# Patient Record
Sex: Female | Born: 1960 | Race: White | Hispanic: No | Marital: Single | Smoking: Current every day smoker
Health system: Southern US, Community
[De-identification: ages and names within clinical notes are randomized; demographics above are authoritative.]

## PROBLEM LIST (undated history)

## (undated) DIAGNOSIS — F329 Major depressive disorder, single episode, unspecified: Secondary | ICD-10-CM

## (undated) DIAGNOSIS — T7840XA Allergy, unspecified, initial encounter: Secondary | ICD-10-CM

## (undated) DIAGNOSIS — F419 Anxiety disorder, unspecified: Secondary | ICD-10-CM

## (undated) DIAGNOSIS — F32A Depression, unspecified: Secondary | ICD-10-CM

## (undated) DIAGNOSIS — I10 Essential (primary) hypertension: Secondary | ICD-10-CM

## (undated) HISTORY — DX: Essential (primary) hypertension: I10

## (undated) HISTORY — DX: Major depressive disorder, single episode, unspecified: F32.9

## (undated) HISTORY — DX: Allergy, unspecified, initial encounter: T78.40XA

## (undated) HISTORY — PX: OTHER SURGICAL HISTORY: SHX169

## (undated) HISTORY — DX: Anxiety disorder, unspecified: F41.9

## (undated) HISTORY — DX: Depression, unspecified: F32.A

## (undated) HISTORY — PX: APPENDECTOMY: SHX54

---
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
## 1999-11-12 ENCOUNTER — Emergency Department (HOSPITAL_COMMUNITY): Admission: EM | Admit: 1999-11-12 | Discharge: 1999-11-12 | Payer: Self-pay | Admitting: Emergency Medicine

## 1999-11-12 ENCOUNTER — Encounter: Payer: Self-pay | Admitting: Emergency Medicine

## 2000-08-07 ENCOUNTER — Emergency Department (HOSPITAL_COMMUNITY): Admission: EM | Admit: 2000-08-07 | Discharge: 2000-08-07 | Payer: Self-pay | Admitting: Emergency Medicine

## 2002-03-13 ENCOUNTER — Inpatient Hospital Stay (HOSPITAL_COMMUNITY): Admission: EM | Admit: 2002-03-13 | Discharge: 2002-03-17 | Payer: Self-pay | Admitting: *Deleted

## 2004-01-24 ENCOUNTER — Ambulatory Visit: Payer: Self-pay | Admitting: Family Medicine

## 2004-03-01 ENCOUNTER — Ambulatory Visit: Payer: Self-pay | Admitting: Family Medicine

## 2004-03-02 ENCOUNTER — Ambulatory Visit: Payer: Self-pay | Admitting: *Deleted

## 2004-03-15 ENCOUNTER — Ambulatory Visit: Payer: Self-pay | Admitting: Family Medicine

## 2004-04-13 ENCOUNTER — Emergency Department (HOSPITAL_COMMUNITY): Admission: EM | Admit: 2004-04-13 | Discharge: 2004-04-13 | Payer: Self-pay | Admitting: Emergency Medicine

## 2004-04-27 ENCOUNTER — Ambulatory Visit: Payer: Self-pay | Admitting: Family Medicine

## 2005-01-23 ENCOUNTER — Ambulatory Visit: Payer: Self-pay | Admitting: Family Medicine

## 2005-02-13 ENCOUNTER — Ambulatory Visit: Payer: Self-pay | Admitting: Family Medicine

## 2005-04-11 ENCOUNTER — Ambulatory Visit: Payer: Self-pay | Admitting: Family Medicine

## 2005-04-24 ENCOUNTER — Ambulatory Visit (HOSPITAL_COMMUNITY): Admission: RE | Admit: 2005-04-24 | Discharge: 2005-04-24 | Payer: Self-pay | Admitting: Family Medicine

## 2005-07-17 ENCOUNTER — Encounter (INDEPENDENT_AMBULATORY_CARE_PROVIDER_SITE_OTHER): Payer: Self-pay | Admitting: Family Medicine

## 2005-07-17 LAB — CONVERTED CEMR LAB

## 2005-07-25 ENCOUNTER — Encounter (INDEPENDENT_AMBULATORY_CARE_PROVIDER_SITE_OTHER): Payer: Self-pay | Admitting: Family Medicine

## 2005-07-25 ENCOUNTER — Ambulatory Visit: Payer: Self-pay | Admitting: Family Medicine

## 2005-08-02 ENCOUNTER — Ambulatory Visit: Payer: Self-pay | Admitting: Family Medicine

## 2005-08-09 ENCOUNTER — Ambulatory Visit: Payer: Self-pay | Admitting: Family Medicine

## 2005-12-18 ENCOUNTER — Ambulatory Visit: Payer: Self-pay | Admitting: Family Medicine

## 2006-01-29 ENCOUNTER — Ambulatory Visit: Payer: Self-pay | Admitting: Family Medicine

## 2006-08-07 ENCOUNTER — Ambulatory Visit: Payer: Self-pay | Admitting: Family Medicine

## 2006-10-02 ENCOUNTER — Ambulatory Visit: Payer: Self-pay | Admitting: Internal Medicine

## 2006-11-05 ENCOUNTER — Encounter (INDEPENDENT_AMBULATORY_CARE_PROVIDER_SITE_OTHER): Payer: Self-pay | Admitting: Family Medicine

## 2006-11-05 DIAGNOSIS — L708 Other acne: Secondary | ICD-10-CM | POA: Insufficient documentation

## 2006-11-05 DIAGNOSIS — I10 Essential (primary) hypertension: Secondary | ICD-10-CM | POA: Insufficient documentation

## 2006-11-05 DIAGNOSIS — E785 Hyperlipidemia, unspecified: Secondary | ICD-10-CM | POA: Insufficient documentation

## 2006-11-18 DIAGNOSIS — F319 Bipolar disorder, unspecified: Secondary | ICD-10-CM | POA: Insufficient documentation

## 2006-11-18 DIAGNOSIS — L280 Lichen simplex chronicus: Secondary | ICD-10-CM | POA: Insufficient documentation

## 2006-11-22 ENCOUNTER — Ambulatory Visit: Payer: Self-pay | Admitting: Family Medicine

## 2006-12-04 ENCOUNTER — Encounter (INDEPENDENT_AMBULATORY_CARE_PROVIDER_SITE_OTHER): Payer: Self-pay | Admitting: *Deleted

## 2007-04-28 ENCOUNTER — Ambulatory Visit: Payer: Self-pay | Admitting: Nurse Practitioner

## 2007-05-05 ENCOUNTER — Ambulatory Visit: Payer: Self-pay | Admitting: Nurse Practitioner

## 2007-05-05 DIAGNOSIS — R110 Nausea: Secondary | ICD-10-CM | POA: Insufficient documentation

## 2007-05-05 LAB — CONVERTED CEMR LAB
Beta hcg, urine, semiquantitative: NEGATIVE
Bilirubin Urine: NEGATIVE
Glucose, Urine, Semiquant: NEGATIVE
Ketones, urine, test strip: NEGATIVE
Nitrite: NEGATIVE
Protein, U semiquant: NEGATIVE
Specific Gravity, Urine: 1.015
Urobilinogen, UA: NEGATIVE
pH: 6

## 2007-05-06 ENCOUNTER — Telehealth (INDEPENDENT_AMBULATORY_CARE_PROVIDER_SITE_OTHER): Payer: Self-pay | Admitting: Nurse Practitioner

## 2007-09-12 ENCOUNTER — Telehealth (INDEPENDENT_AMBULATORY_CARE_PROVIDER_SITE_OTHER): Payer: Self-pay | Admitting: *Deleted

## 2007-09-26 ENCOUNTER — Encounter (INDEPENDENT_AMBULATORY_CARE_PROVIDER_SITE_OTHER): Payer: Self-pay | Admitting: *Deleted

## 2008-03-03 ENCOUNTER — Encounter (INDEPENDENT_AMBULATORY_CARE_PROVIDER_SITE_OTHER): Payer: Self-pay | Admitting: Family Medicine

## 2008-05-17 ENCOUNTER — Ambulatory Visit: Payer: Self-pay | Admitting: Nurse Practitioner

## 2008-05-17 DIAGNOSIS — F17200 Nicotine dependence, unspecified, uncomplicated: Secondary | ICD-10-CM | POA: Insufficient documentation

## 2008-05-17 LAB — CONVERTED CEMR LAB
ALT: 10 units/L (ref 0–35)
AST: 15 units/L (ref 0–37)
Albumin: 4.3 g/dL (ref 3.5–5.2)
Alkaline Phosphatase: 74 units/L (ref 39–117)
BUN: 11 mg/dL (ref 6–23)
Band Neutrophils: 0 % (ref 0–10)
Basophils Absolute: 0 10*3/uL (ref 0.0–0.1)
Basophils Relative: 1 % (ref 0–1)
CO2: 21 meq/L (ref 19–32)
Calcium: 8.9 mg/dL (ref 8.4–10.5)
Chloride: 104 meq/L (ref 96–112)
Cholesterol, target level: 200 mg/dL
Cholesterol: 185 mg/dL (ref 0–200)
Creatinine, Ser: 0.66 mg/dL (ref 0.40–1.20)
Eosinophils Absolute: 0.3 10*3/uL (ref 0.0–0.7)
Eosinophils Relative: 5 % (ref 0–5)
Glucose, Bld: 79 mg/dL (ref 70–99)
HCT: 40.9 % (ref 36.0–46.0)
HDL goal, serum: 40 mg/dL
HDL: 37 mg/dL — ABNORMAL LOW (ref >39)
Hemoglobin: 12.8 g/dL (ref 12.0–15.0)
LDL Cholesterol: 118 mg/dL — ABNORMAL HIGH (ref 0–99)
LDL Goal: 130 mg/dL
Lymphocytes Relative: 22 % (ref 12–46)
Lymphs Abs: 1.5 10*3/uL (ref 0.7–4.0)
MCHC: 31.3 g/dL (ref 30.0–36.0)
MCV: 88.7 fL (ref 78.0–100.0)
Monocytes Absolute: 0.7 10*3/uL (ref 0.1–1.0)
Monocytes Relative: 10 % (ref 3–12)
Neutro Abs: 4.3 10*3/uL (ref 1.7–7.7)
Neutrophils Relative %: 63 % (ref 43–77)
Platelets: 325 10*3/uL (ref 150–400)
Potassium: 4.3 meq/L (ref 3.5–5.3)
RBC: 4.61 M/uL (ref 3.87–5.11)
RDW: 14.6 % (ref 11.5–15.5)
Sodium: 139 meq/L (ref 135–145)
TSH: 1.18 microintl units/mL (ref 0.350–4.50)
Total Bilirubin: 0.3 mg/dL (ref 0.3–1.2)
Total CHOL/HDL Ratio: 5
Total Protein: 7.9 g/dL (ref 6.0–8.3)
Triglycerides: 150 mg/dL — ABNORMAL HIGH (ref <150)
VLDL: 30 mg/dL (ref 0–40)
WBC: 6.8 10*3/uL (ref 4.0–10.5)

## 2008-05-28 ENCOUNTER — Telehealth (INDEPENDENT_AMBULATORY_CARE_PROVIDER_SITE_OTHER): Payer: Self-pay | Admitting: *Deleted

## 2009-10-27 ENCOUNTER — Ambulatory Visit: Payer: Self-pay | Admitting: Nurse Practitioner

## 2009-10-27 LAB — CONVERTED CEMR LAB
ALT: 9 units/L (ref 0–35)
AST: 14 units/L (ref 0–37)
Albumin: 4.8 g/dL (ref 3.5–5.2)
Alkaline Phosphatase: 65 units/L (ref 39–117)
BUN: 11 mg/dL (ref 6–23)
Basophils Absolute: 0 10*3/uL (ref 0.0–0.1)
Basophils Relative: 0 % (ref 0–1)
Bilirubin Urine: NEGATIVE
CO2: 19 meq/L (ref 19–32)
Calcium: 9.4 mg/dL (ref 8.4–10.5)
Chlamydia, DNA Probe: NEGATIVE
Chloride: 103 meq/L (ref 96–112)
Cholesterol: 191 mg/dL (ref 0–200)
Creatinine, Ser: 0.72 mg/dL (ref 0.40–1.20)
Eosinophils Absolute: 0.2 10*3/uL (ref 0.0–0.7)
Eosinophils Relative: 2 % (ref 0–5)
GC Probe Amp, Genital: NEGATIVE
Glucose, Bld: 79 mg/dL (ref 70–99)
Glucose, Urine, Semiquant: NEGATIVE
HCT: 41.9 % (ref 36.0–46.0)
HDL: 40 mg/dL (ref >39)
Hemoglobin: 13.5 g/dL (ref 12.0–15.0)
KOH Prep: NEGATIVE
Ketones, urine, test strip: NEGATIVE
LDL Cholesterol: 126 mg/dL — ABNORMAL HIGH (ref 0–99)
Lymphocytes Relative: 16 % (ref 12–46)
Lymphs Abs: 1.7 10*3/uL (ref 0.7–4.0)
MCHC: 32.2 g/dL (ref 30.0–36.0)
MCV: 89.7 fL (ref 78.0–100.0)
Microalb, Ur: 1.52 mg/dL (ref 0.00–1.89)
Monocytes Absolute: 1.1 10*3/uL — ABNORMAL HIGH (ref 0.1–1.0)
Monocytes Relative: 11 % (ref 3–12)
Neutro Abs: 7.3 10*3/uL (ref 1.7–7.7)
Neutrophils Relative %: 70 % (ref 43–77)
Nitrite: NEGATIVE
Platelets: 316 10*3/uL (ref 150–400)
Potassium: 4 meq/L (ref 3.5–5.3)
Protein, U semiquant: NEGATIVE
RBC: 4.67 M/uL (ref 3.87–5.11)
RDW: 15 % (ref 11.5–15.5)
Sodium: 135 meq/L (ref 135–145)
Specific Gravity, Urine: >=1.03
TSH: 0.885 microintl units/mL (ref 0.350–4.500)
Total Bilirubin: 0.5 mg/dL (ref 0.3–1.2)
Total CHOL/HDL Ratio: 4.8
Total Protein: 8 g/dL (ref 6.0–8.3)
Triglycerides: 124 mg/dL (ref <150)
Urobilinogen, UA: 0.2
VLDL: 25 mg/dL (ref 0–40)
WBC: 10.5 10*3/uL (ref 4.0–10.5)
pH: 5.5

## 2009-11-01 ENCOUNTER — Ambulatory Visit: Payer: Self-pay | Admitting: Nurse Practitioner

## 2009-11-01 LAB — CONVERTED CEMR LAB: Pap Smear: NEGATIVE

## 2009-11-03 ENCOUNTER — Encounter (INDEPENDENT_AMBULATORY_CARE_PROVIDER_SITE_OTHER): Payer: Self-pay | Admitting: Nurse Practitioner

## 2010-01-16 ENCOUNTER — Emergency Department (HOSPITAL_COMMUNITY): Admission: EM | Admit: 2010-01-16 | Discharge: 2010-01-16 | Payer: Self-pay | Admitting: Emergency Medicine

## 2010-01-27 ENCOUNTER — Ambulatory Visit: Payer: Self-pay | Admitting: Internal Medicine

## 2010-01-27 DIAGNOSIS — S61409A Unspecified open wound of unspecified hand, initial encounter: Secondary | ICD-10-CM | POA: Insufficient documentation

## 2010-02-27 ENCOUNTER — Ambulatory Visit: Payer: Self-pay | Admitting: Nurse Practitioner

## 2010-04-09 ENCOUNTER — Encounter: Payer: Self-pay | Admitting: Internal Medicine

## 2010-04-20 NOTE — Assessment & Plan Note (Signed)
Summary: 1 MONTH FU FOR BP///KT  Nurse Visit   Vital Signs:  Patient profile:   50 year old female Pulse rate:   72 / minute Pulse rhythm:   regular Resp:     22 per minute BP sitting:   110 / 64  (right arm)  Vitals Entered By: Dutch Quint RN (February 27, 2010 2:22 PM)  Impression & Recommendations:  Problem # 1:  HYPERTENSION (ICD-401.9) BP much improved -- 110/64 Continue taking meds as ordered F/u in 3-4 months with provider.   Her updated medication list for this problem includes:    Lisinopril-hydrochlorothiazide 10-12.5 Mg Tabs (Lisinopril-hydrochlorothiazide) .Marland Kitchen... Take one tablet daily for blood pressure  Complete Medication List: 1)  Lisinopril-hydrochlorothiazide 10-12.5 Mg Tabs (Lisinopril-hydrochlorothiazide) .... Take one tablet daily for blood pressure 2)  Lexapro 10 Mg Tabs (Escitalopram oxalate) .... One once daily 3)  Zetia 10 Mg Tabs (Ezetimibe) .... One tablet by mouth daily for cholesterol 4)  Hydroxyzine Hcl 25 Mg Tabs (Hydroxyzine hcl) .... One  to two two times a day   Patient Instructions: 1)  Your blood pressure is much better -- 110/64 2)  Continue taking medications as ordered. 3)  Make appointment with your provider for a follow-up appointment in the next 3-4 months. 4)  Call if anything changes or if you have any questions.   Review of Systems CV:  Complains of lightheadness; States lightheadedness isn't new occurrence, postural changes.  Denies headache, cough, visual changes except some blurriness with close-up work and reading.Marland Kitchen   Physical Exam  General:  alert, well-developed, and well-hydrated.     CC:  BP recheck.  History of Present Illness: 01/27/10 BP 158/90.  Had sutures removed.  Had stopped taking HCTZ due to urinary frequency.  Started on lisinopril-HCTZ.  Had started taking only half a dose for the first week, afraid of getting nauseated.  States has been taking full dose about three weeks.  Here for BP recheck.  CC:  BP recheck Is Patient Diabetic? No Pain Assessment Patient in pain? yes     Location: left hand Intensity: 8 Type: aching, throbbing Onset of pain  laceration occurred 01/16/10.  Dependent position increases pain and edema, hurts to bend hand.  Does patient need assistance? Functional Status Self care Ambulation Normal   Allergies: 1)  ! Penicillin 2)  ! Erythromycin  Orders Added: 1)  Est. Patient Level I [[address]]

## 2010-04-20 NOTE — Progress Notes (Signed)
Summary: Office Visit/DEPRESSION SCREENING  Office Visit/DEPRESSION SCREENING   Imported By: Arta Bruce 10/31/2009 15:05:41  _____________________________________________________________________  External Attachment:    Type:   Image     Comment:   External Document

## 2010-04-20 NOTE — Assessment & Plan Note (Signed)
Summary: STITCHES REMOVED///KT  Nurse Visit   Vital Signs:  Patient profile:   50 year old female Temp:     97.0 degrees F oral Pulse rate:   76 / minute Pulse rhythm:   regular Resp:     20 per minute BP sitting:   158 / 90  (right arm)  Impression & Recommendations:  Problem # 1:  LACERATION, HAND, LEFT (ICD-882.0) Laceration well-healed, eight sutures removed.  Orders: Suture Removal by Non-Operative MD (E4540)  Problem # 2:  HYPERTENSION (ICD-401.9) BP still elevated c/o urinating too much  To stop HCTZ 25 mg. and start lisinopril-HCTZ Return in one month for BP recheck with triage nurse   Her updated medication list for this problem includes:    Lisinopril-hydrochlorothiazide 10-12.5 Mg Tabs (Lisinopril-hydrochlorothiazide) .Marland Kitchen... Take one tablet daily for blood pressure  Orders: Est. Patient Level II (98119)  Complete Medication List: 1)  Lisinopril-hydrochlorothiazide 10-12.5 Mg Tabs (Lisinopril-hydrochlorothiazide) .... Take one tablet daily for blood pressure 2)  Lexapro 10 Mg Tabs (Escitalopram oxalate) .... One once daily 3)  Zetia 10 Mg Tabs (Ezetimibe) .... One tablet by mouth daily for cholesterol 4)  Hydroxyzine Hcl 25 Mg Tabs (Hydroxyzine hcl) .... One  to two two times a day  Other Orders: Flu Vaccine 16yrs + (14782) Admin 1st Vaccine (95621)   CC:  suture removal left hand.  History of Present Illness: Took half of her medication this morning; states it makes her urinate all the time.   Review of Systems Derm:  s/p laceration to left hand, received 8 sutures.  Some residual swelling near fifth digit.Marland Kitchen   Physical Exam  Skin:  well-healing laceration to left lateral hand; edges well-approximated, no redness, slight edema to base of fifth digit.  No drainage or odor.   Patient Instructions: 1)  Reviewed with Jesse Fall 2)  Your sutures have been removed -- clean gently with soap and water.  You may apply antibiotic ointment lightly, then  cover with a bandage to keep the area clean. 3)  Your blood pressure is elevated -- 158/90. 4)  Stop taking the hydrochlorothiazide.   5)  Start the lisinopril-hydrochlorothiazide 10-12.5 mg, one tablet by mouth daily. 6)  You have received your flu vaccine. 7)  Return in one month to have your blood pressure checked by the triage nurse -- make sure you take all of your medications before you come. 8)  Call if anything changes or if you have any questions.  CC: suture removal left hand Is Patient Diabetic? No Pain Assessment Patient in pain? yes     Location: left hand Intensity: 6 Type: sharp/aching Onset of pain  left hand laceration  Does patient need assistance? Functional Status Self care Ambulation Normal   Allergies: 1)  ! Penicillin 2)  ! Erythromycin  Immunizations Administered:  Influenza Vaccine # 1:    Vaccine Type: Fluvax 3+    Site: right deltoid    Mfr: GlaxoSmithKline    Dose: 0.5 ml    Route: IM    Given by: Dutch Quint RN    Exp. Date: 09/16/2010    Lot #: [id number]    VIS given: 10/11/09 version given January 27, 2010.  Flu Vaccine Consent Questions:    Do you have a history of severe allergic reactions to this vaccine? no    Any prior history of allergic reactions to egg and/or gelatin? no    Do you have a sensitivity to the preservative Thimersol? no    Do  you have a past history of Guillan-Barre Syndrome? no    Do you currently have an acute febrile illness? no    Have you ever had a severe reaction to latex? no    Vaccine information given and explained to patient? yes    Are you currently pregnant? no  Orders Added: 1)  Flu Vaccine 81yrs + [90658] 2)  Admin 1st Vaccine [90471] 3)  Est. Patient Level II [19147] 4)  Suture Removal by Non-Operative MD [S0630] Prescriptions: LISINOPRIL-HYDROCHLOROTHIAZIDE 10-12.5 MG TABS (LISINOPRIL-HYDROCHLOROTHIAZIDE) Take one tablet daily for blood pressure  #30 x 3   Entered by:   Dutch Quint RN    Authorized by:   Lehman Prom FNP   Signed by:   Dutch Quint RN on 01/27/2010   Method used:   Print then Give to Patient   RxID:   [id number]

## 2010-04-20 NOTE — Assessment & Plan Note (Signed)
Summary: Complete Physical Exam   Vital Signs:  Patient profile:   50 year old female LMP:     09/2009 Height:      61.50 inches Weight:      94.2 pounds BMI:     17.57 Temp:     97.9 degrees F oral Pulse rate:   87 / minute Pulse rhythm:   regular Resp:     20 per minute BP sitting:   150 / 101  (left arm) Cuff size:   regular  Vitals Entered ByLevon Hedger (October 27, 2009 2:56 PM)  Serial Vital Signs/Assessments:  Time      Position  BP       Pulse  Resp  Temp     By                     152/100                        Levon Hedger  CC: CPP...needs forms filled out, Hypertension Management, Lipid Management Is Patient Diabetic? No Pain Assessment Patient in pain? no       Does patient need assistance? Functional Status Self care Ambulation Normal LMP (date): 09/2009     Enter LMP: 09/2009 Last PAP Result Done   CC:  CPP...needs forms filled out, Hypertension Management, and Lipid Management.  History of Present Illness:  Pt into the office for complete physical exam She also has a form that she needs to complete for DMV  PAP - last done in this office in 2007. irregular menstrual cycles  Mammogram - last done with last CPE in 2007 no history of breast cancer no self breast exams at home  optho  - no recent eye exams but does wear reading glasses  Dental - no recent dental exam  tdap - over 10 years since last booster  Hypertension History:      She denies headache, chest pain, and palpitations.  pt has not taken any BP meds in over 1 year.        Positive major cardiovascular risk factors include hyperlipidemia, hypertension, and current tobacco user.  Negative major cardiovascular risk factors include female age less than 90 years old, no history of diabetes, and negative family history for ischemic heart disease.        Further assessment for target organ damage reveals no history of ASHD, cardiac end-organ damage (CHF/LVH), stroke/TIA,  peripheral vascular disease, renal insufficiency, or hypertensive retinopathy.    Lipid Management History:      Positive NCEP/ATP III risk factors include HDL cholesterol less than 40, current tobacco user, and hypertension.  Negative NCEP/ATP III risk factors include female age less than 77 years old, non-diabetic, no family history for ischemic heart disease, no ASHD (atherosclerotic heart disease), no prior stroke/TIA, no peripheral vascular disease, and no history of aortic aneurysm.        Comments include: pt has not taken medications in over 1 year.      Habits & Providers  Alcohol-Tobacco-Diet     Alcohol type: quit in 2006     Tobacco Status: current     Tobacco Counseling: to quit use of tobacco products  Exercise-Depression-Behavior     Drug Use: no     Seat Belt Use: 100     Sun Exposure: occasionally  Comments: PHQ-9 score = 8  Medications Prior to Update: 1)  Hydrochlorothiazide 25 Mg Tabs (Hydrochlorothiazide) .... Take One (  1) By Mouth Daily For Blood Pressure 2)  Lexapro 10 Mg  Tabs (Escitalopram Oxalate) .... One Once Daily 3)  Naprosyn 500 Mg  Tabs (Naproxen) .... One By Mouth Every 12 Hours As Needed For Groin Pain 4)  Zetia 10 Mg  Tabs (Ezetimibe) .... One Tablet By Mouth Daily For Cholesterol 5)  Hydroxyzine Hcl 25 Mg  Tabs (Hydroxyzine Hcl) .... One  To Two Two Times A Day 6)  Micardis 80 Mg  Tabs (Telmisartan) .Marland Kitchen.. 1 Tablet By Mouth Daily For Blood Pressure 7)  Clobetasol Propionate 0.05 % Crea (Clobetasol Propionate) .... Apply Topically To Affected Area Twice Daily  Allergies (verified): 1)  ! Penicillin 2)  ! Erythromycin  Review of Systems General:  Denies fever. Eyes:  Denies blurring. ENT:  Denies earache. CV:  Denies chest pain or discomfort. Resp:  Denies cough. GI:  Denies abdominal pain, nausea, and vomiting. GU:  Denies discharge. MS:  Denies joint pain. Derm:  Complains of itching and lesion(s). Neuro:  Denies headaches. Psych:   Complains of anxiety; denies depression.  Physical Exam  General:  alert.  thin Head:  normocephalic.   Eyes:  pupils equal, pupils round, and pupils reactive to light.   Ears:  bil ears with moderate cerumen Nose:  no nasal discharge.   Mouth:  pharynx pink and moist and poor dentition.   Neck:  supple.   Chest Wall:  no mass.   Breasts:  no masses.   Lungs:  normal breath sounds.   Heart:  normal rate and regular rhythm.   Abdomen:  normal bowel sounds.   Msk:  normal ROM.   Extremities:  no edema Neurologic:  alert & oriented X3.   Skin:  multiple healed abrasions  Psych:  Oriented X3.    Pelvic Exam  Vulva:      normal appearance.   Urethra and Bladder:      Urethra--normal.   Vagina:      physiologic discharge.   Cervix:      normal.   Uterus:      smooth.   Adnexa:      normal.   Rectum:      normal, heme negative stool.      Impression & Recommendations:  Problem # 1:  ROUTINE GYNECOLOGICAL EXAMINATION (ICD-V72.31) pap done mammogram scheduled - self breast exam placcard given tdap given rec optho and dental exam labs done PHQ-9 score = 3 Orders: KOH/ WET Mount [phone]) Pap Smear, Thin Prep ( Collection of) (U0454) Hemoccult Guaiac-1 spec.(in office) (82270) T- GC Chlamydia (09811)  Problem # 2:  HYPERTENSION (ICD-401.9) advised pt to restart meds DASH diet The following medications were removed from the medication list:    Micardis 80 Mg Tabs (Telmisartan) .Marland Kitchen... 1 tablet by mouth daily for blood pressure Her updated medication list for this problem includes:    Hydrochlorothiazide 25 Mg Tabs (Hydrochlorothiazide) .Marland Kitchen... Take one (1) by mouth daily for blood pressure  Orders: T-TSH ([id number]) T-Urine Microalbumin w/creat. ratio [phone]) UA Dipstick w/o Micro (manual) (52841)  Problem # 3:  TOBACCO ABUSE (ICD-305.1) advised cessation  Problem # 4:  NEURODERMATITIS (ICD-698.3)  Complete Medication List: 1)  Hydrochlorothiazide  25 Mg Tabs (Hydrochlorothiazide) .... Take one (1) by mouth daily for blood pressure 2)  Lexapro 10 Mg Tabs (Escitalopram oxalate) .... One once daily 3)  Zetia 10 Mg Tabs (Ezetimibe) .... One tablet by mouth daily for cholesterol 4)  Hydroxyzine Hcl 25 Mg Tabs (Hydroxyzine hcl) .... One  to  two two times a day  Other Orders: T-Lipid Profile [phone]) T-Comprehensive Metabolic Panel [phone]) T-CBC w/Diff ([id number]) Rapid HIV  (92370) Tdap => 76yrs IM (53664) Admin 1st Vaccine (40347) Admin 1st Vaccine [facility]) [phone])  Hypertension Assessment/Plan:      The patient's hypertensive risk group is category B: At least one risk factor (excluding diabetes) with no target organ damage.  Her calculated 10 year risk of coronary heart disease is 13 %.  Today's blood pressure is 150/101.  Her blood pressure goal is < 140/90.  Lipid Assessment/Plan:      Based on NCEP/ATP III, the patient's risk factor category is "2 or more risk factors and a calculated 10 year CAD risk of < 20%".  The patient's lipid goals are as follows: Total cholesterol goal is 200; LDL cholesterol goal is 130; HDL cholesterol goal is 40; Triglyceride goal is 150.    Patient Instructions: 1)  Follow up in this office on Tuesday - August 16th  2)  Schedule in the office with Aggie Cosier - Triage schedule 3)  Provider will need to complete forms 4)  You have receved a tetanus vaccine.  It will not be due again for 10 years Prescriptions: HYDROCHLOROTHIAZIDE 25 MG TABS (HYDROCHLOROTHIAZIDE) Take one (1) by mouth daily for blood pressure  #90 x 1   Entered and Authorized by:   Lehman Prom FNP   Signed by:   Lehman Prom FNP on 10/27/2009   Method used:   Print then Give to Patient   RxID:   [id number]   Laboratory Results   Urine Tests  Date/Time Received: October 27, 2009 3:22 PM   Routine Urinalysis   Color: lt. yellow Appearance: Clear Glucose: negative   (Normal Range: Negative) Bilirubin:  negative   (Normal Range: Negative) Ketone: negative   (Normal Range: Negative) Spec. Gravity: >=1.030   (Normal Range: 1.003-1.035) Blood: trace-intact   (Normal Range: Negative) pH: 5.5   (Normal Range: 5.0-8.0) Protein: negative   (Normal Range: Negative) Urobilinogen: 0.2   (Normal Range: 0-1) Nitrite: negative   (Normal Range: Negative) Leukocyte Esterace: small   (Normal Range: Negative)    Date/Time Received: October 28, 2009 1:39 PM   Wet Mount/KOH Source: vaginal WBC/hpf: 1-5 Bacteria/hpf: rare Clue cells/hpf: none Yeast/hpf: none Trichomonas/hpf: none  Stool - Occult Blood Hemmoccult #1: negative Date: 10/28/2009   Laboratory Results   Urine Tests    Routine Urinalysis   Color: lt. yellow Appearance: Clear Glucose: negative   (Normal Range: Negative) Bilirubin: negative   (Normal Range: Negative) Ketone: negative   (Normal Range: Negative) Spec. Gravity: >=1.030   (Normal Range: 1.003-1.035) Blood: trace-intact   (Normal Range: Negative) pH: 5.5   (Normal Range: 5.0-8.0) Protein: negative   (Normal Range: Negative) Urobilinogen: 0.2   (Normal Range: 0-1) Nitrite: negative   (Normal Range: Negative) Leukocyte Esterace: small   (Normal Range: Negative)      Wet Mount Wet Mount KOH: Negative     Tetanus/Td Vaccine    Vaccine Type: Tdap    Site: left deltoid    Mfr: Sanofi Pasteur    Dose: 0.5 ml    Route: IM    Given by: Levon Hedger    Exp. Date: 03/01/2012    Lot #: [id number]    VIS given: 02/04/07 version given October 27, 2009.    ndc  60109-323-55   Appended Document: Complete Physical Exam     Allergies: 1)  ! Penicillin 2)  ! Erythromycin   Complete Medication List:  1)  Hydrochlorothiazide 25 Mg Tabs (Hydrochlorothiazide) .... Take one (1) by mouth daily for blood pressure 2)  Lexapro 10 Mg Tabs (Escitalopram oxalate) .... One once daily 3)  Zetia 10 Mg Tabs (Ezetimibe) .... One tablet by mouth daily for cholesterol 4)   Hydroxyzine Hcl 25 Mg Tabs (Hydroxyzine hcl) .... One  to two two times a day   Laboratory Results  Date/Time Received: October 28, 2009 3:49 PM   Other Tests  Rapid HIV: negative

## 2010-04-20 NOTE — Letter (Signed)
Summary: Lipid Letter  [facility]  [address] [place], [place] [address]   Phone: [phone]  Fax: [phone]    11/01/2009  Katie Montgomery [address] [place], [place]  [address]  Dear Katie Montgomery:  We have carefully reviewed your last lipid profile from 10/27/2009 and the results are noted below with a summary of recommendations for lipid management.    Cholesterol:       191     Goal: less than 200   HDL "good" Cholesterol:   40     Goal: greater than 40   LDL "bad" Cholesterol:   126     Goal: less than 130   Triglycerides:       124     Goal: less than 150    Labs done during recent office visit are normal.  Pap Smear results are normal.     Current Medications: 1)    Hydrochlorothiazide 25 Mg Tabs (Hydrochlorothiazide) .... Take one (1) by mouth daily for blood pressure 2)    Lexapro 10 Mg  Tabs (Escitalopram oxalate) .... One once daily 3)    Hydroxyzine Hcl 25 Mg  Tabs (Hydroxyzine hcl) .... One  to two two times a day  If you have any questions, please call. We appreciate being able to work with you.   Sincerely,    [facility] Beverley Fiedler MD

## 2010-04-20 NOTE — Assessment & Plan Note (Signed)
Summary: BP recheck visit  Nurse Visit   Vital Signs:  Patient profile:   50 year old female Weight:      92.6 pounds Pulse rate:   76 / minute Pulse rhythm:   regular Resp:     20 per minute  Vitals Entered By: Dutch Quint RN (November 01, 2009 10:30 AM)  Patient Instructions: 1)  Blood pressure is good -- goal is to keep it less than 140/90. 2)  Continue taking hydrochlorothiazide. 3)  Follow-up appt. in six months.  Call if anything changes before then.  CC: BP recheck Is Patient Diabetic? No Pain Assessment Patient in pain? no       Does patient need assistance? Functional Status Self care Ambulation Normal   CC:  BP recheck.  History of Present Illness: In office for f/u BP check.  Last visit was 150/101 -- taking HCTZ regularly, took meds this AM, has not smoked for over an hour before visit.   Review of Systems       States has headaches on and off.  Having some lateral left sided CP this am, thinks "it's just a little gas."  No diaphoresis or SOB, pulse regular.     Physical Exam  General:  alert, well-hydrated, and underweight appearing.   Head:  normocephalic.      Allergies: 1)  ! Penicillin 2)  ! Erythromycin  Orders Added: 1)  Est. Patient Level I [30865]

## 2010-04-20 NOTE — Letter (Signed)
Summary: PATIENT'S MEDICAL HISTORY  PATIENT'S MEDICAL HISTORY   Imported By: Arta Bruce 11/03/2009 11:33:25  _____________________________________________________________________  External Attachment:    Type:   Image     Comment:   External Document

## 2010-08-04 NOTE — Discharge Summary (Signed)
NAME:  Katie Montgomery, Katie Montgomery                        ACCOUNT NO.:  [id number]   MEDICAL RECORD NO.:  [id number]                   PATIENT TYPE:  IPS   LOCATION:  0504                                 FACILITY:  [facility]   PHYSICIAN:  Jeanice Lim, M.D.              DATE OF BIRTH:  Sep 15, 1960   DATE OF ADMISSION:  03/13/2002  DATE OF DISCHARGE:  03/17/2002                                 DISCHARGE SUMMARY   IDENTIFYING DATA:  This is a 51 year old separated white female voluntarily  admitted.  She presented with a history of self-inflicted injury.  She used  a Physicist, medical to cut both wrists at home.  She was drinking at the time.  She reported not having a history of drinking and felt intoxicated.  Intention was to die.  She left no note.  She continued to feel depressed.  The patient had a hospitalization in the past at [facility] for  cutting her wrists and [facility] for outpatient  followup.   MEDICATIONS:  1. Zoloft 50 mg t.i.d.  2. Doxycycline.   DRUG ALLERGIES:  PENICILLIN, ERYTHROMYCIN.   PHYSICAL EXAMINATION:  GENERAL: Essentially within normal limits.  The  patient did have dressings bilateral on both wrists.  NEUROLOGIC: Nonfocal.   LABORATORY DATA:  Routine admission labs: Urinalysis was negative.  CMET was  within normal limits.  CBC was within normal limits.  Urine drug screen was  negative.  Alcohol level 183.  Salicylate level 24.   MENTAL STATUS EXAM:  The patient was in bed, primarily cooperative.  Speech  was soft spoken.  Mood was sad.  Affect: Flat.  Thought process: Goal  directed.  Thought content: Negative for dangerous ideation or psychotic  symptoms.  Cognitive: Intact.  Judgment and insight: Fair.   ADMISSION DIAGNOSES:   AXIS I:  Major depressive disorder with suicidal ideation.   AXIS II:  Deferred.   AXIS III:  Lacerated wrists.   AXIS IV:  Moderate problems with primary support group and other  psychosocial stressors.   AXIS V:  [id number]   HOSPITAL COURSE:  The patient was admitted, ordered routine p.r.n.  medications, underwent further monitoring, and reported motivation to seek  substance abuse treatment.  The patient reported then planning to stay with  parents.  She had felt oversedated initially, got medication late, and then  later felt less sedated.  She had no mood swings and reported motivation to  remain sober and follow up with aftercare recommendations.   CONDITION ON DISCHARGE:  Her condition on discharge was improved.  Mood was  euthymic.  Affect: Brighter.  Thought processes: Goal directed.  Thought  content: Negative for dangerous ideation or psychotic symptoms.  The patient  reported no urge to cut on self or hurt self or others.  The patient felt  she had learned healthier coping skills.   DISCHARGE MEDICATIONS:  1. Lamictal  25 mg one q.a.m. and two q.h.s.  2. Zoloft 100 mg q.a.m.  3. Trazodone 100 mg q.h.s.  4. Trileptal 150 mg q.a.m. and 150 mg q.h.s.  5. Vistaril 50 mg q.6.h. p.r.n. anxiety.   FOLLOW UP:  The patient was to follow up at [facility] on Tuesday, January 6, with [place].   DISCHARGE DIAGNOSES:   AXIS I:  Major depressive disorder with suicidal ideation.   AXIS II:  Deferred.   AXIS III:  Lacerated wrists.   AXIS IV:  Moderate problems with primary support group and other  psychosocial stressors.   AXIS V:  Global assessment of functioning on discharge was 55.                                                Jeanice Lim, M.D.    JEM/MEDQ  D:  03/26/2002  T:  03/26/2002  Job:  [id number]

## 2011-04-24 ENCOUNTER — Other Ambulatory Visit (HOSPITAL_COMMUNITY): Payer: Self-pay | Admitting: Family Medicine

## 2011-04-24 ENCOUNTER — Other Ambulatory Visit: Payer: Self-pay | Admitting: Family Medicine

## 2011-04-24 DIAGNOSIS — Z1231 Encounter for screening mammogram for malignant neoplasm of breast: Secondary | ICD-10-CM

## 2011-04-24 DIAGNOSIS — N938 Other specified abnormal uterine and vaginal bleeding: Secondary | ICD-10-CM

## 2011-05-02 ENCOUNTER — Other Ambulatory Visit (HOSPITAL_COMMUNITY): Payer: Self-pay

## 2011-05-02 ENCOUNTER — Ambulatory Visit (HOSPITAL_COMMUNITY)
Admission: RE | Admit: 2011-05-02 | Discharge: 2011-05-02 | Disposition: A | Discharge status: Home / Self Care | Payer: Self-pay | Source: Ambulatory Visit | Attending: Family Medicine | Admitting: Family Medicine

## 2011-05-02 DIAGNOSIS — N938 Other specified abnormal uterine and vaginal bleeding: Secondary | ICD-10-CM | POA: Insufficient documentation

## 2011-05-02 DIAGNOSIS — N854 Malposition of uterus: Secondary | ICD-10-CM | POA: Insufficient documentation

## 2011-05-02 DIAGNOSIS — N949 Unspecified condition associated with female genital organs and menstrual cycle: Secondary | ICD-10-CM | POA: Insufficient documentation

## 2011-05-02 DIAGNOSIS — N72 Inflammatory disease of cervix uteri: Secondary | ICD-10-CM | POA: Insufficient documentation

## 2011-05-02 DIAGNOSIS — N925 Other specified irregular menstruation: Secondary | ICD-10-CM | POA: Insufficient documentation

## 2011-05-02 DIAGNOSIS — N83209 Unspecified ovarian cyst, unspecified side: Secondary | ICD-10-CM | POA: Insufficient documentation

## 2011-05-02 DIAGNOSIS — D259 Leiomyoma of uterus, unspecified: Secondary | ICD-10-CM | POA: Insufficient documentation

## 2011-05-23 ENCOUNTER — Ambulatory Visit (HOSPITAL_COMMUNITY): Payer: Self-pay

## 2011-06-11 ENCOUNTER — Encounter: Payer: Self-pay | Admitting: Family

## 2011-06-11 ENCOUNTER — Ambulatory Visit (INDEPENDENT_AMBULATORY_CARE_PROVIDER_SITE_OTHER): Payer: Self-pay | Admitting: Family

## 2011-06-11 ENCOUNTER — Other Ambulatory Visit (HOSPITAL_COMMUNITY)
Admission: RE | Admit: 2011-06-11 | Discharge: 2011-06-11 | Disposition: A | Discharge status: Home / Self Care | Payer: Self-pay | Source: Ambulatory Visit | Attending: Family | Admitting: Family

## 2011-06-11 VITALS — BP 126/88 | HR 75 | Temp 96.8°F | Ht 63.0 in | Wt 100.5 lb

## 2011-06-11 DIAGNOSIS — N83201 Unspecified ovarian cyst, right side: Secondary | ICD-10-CM

## 2011-06-11 DIAGNOSIS — N83209 Unspecified ovarian cyst, unspecified side: Secondary | ICD-10-CM

## 2011-06-11 DIAGNOSIS — N939 Abnormal uterine and vaginal bleeding, unspecified: Secondary | ICD-10-CM

## 2011-06-11 DIAGNOSIS — N938 Other specified abnormal uterine and vaginal bleeding: Secondary | ICD-10-CM | POA: Insufficient documentation

## 2011-06-11 DIAGNOSIS — Z01812 Encounter for preprocedural laboratory examination: Secondary | ICD-10-CM

## 2011-06-11 DIAGNOSIS — N925 Other specified irregular menstruation: Secondary | ICD-10-CM | POA: Insufficient documentation

## 2011-06-11 DIAGNOSIS — N949 Unspecified condition associated with female genital organs and menstrual cycle: Secondary | ICD-10-CM | POA: Insufficient documentation

## 2011-06-11 DIAGNOSIS — N926 Irregular menstruation, unspecified: Secondary | ICD-10-CM

## 2011-06-11 LAB — POCT PREGNANCY, URINE: Preg Test, Ur: NEGATIVE

## 2011-06-11 MED ORDER — METRONIDAZOLE 500 MG PO TABS: 500.0000 mg | ORAL_TABLET | Freq: Two times a day (BID) | ORAL | Status: AC

## 2011-06-11 NOTE — Progress Notes (Deleted)
  Subjective:    Patient ID: Katie Montgomery, female    DOB: 10-13-1960, 51 y.o.   MRN: [id number]  HPI    Review of Systems     Objective:   Physical Exam        Assessment & Plan:

## 2011-06-11 NOTE — Progress Notes (Signed)
  Subjective:     Katie Montgomery is an 51 y.o. woman who presents for irregular menses. She had been bleeding irregularly. She is now bleeding every 21-25 days and menses are lasting 7 days. She changes her pad or tampon every 2 hours. Clots are n/a cm in size. Dysmenorrhea:moderate, occurring first 1-2 days of flow. Cyclic symptoms include: anxiety, bloating and weight gain. Current contraception: condoms. History of infertility: no. History of abnormal Pap smear: normal 04/24/11.  Pt also report abnormal vaginal smell x 3 weeks, "fishy".    Last ultrasound result (04/24/11): 1. Endometrium upper normal in thickness at 8 mm without evidence  of endometrial fluid or mass.  2. Approximate 8 cm posterior fundal uterine fibroid. Retroverted  uterus.  3. Complex, septated cyst with a debris level involving the right  ovary approximating 4 cm. No free pelvic fluid to confirm cyst  rupture.  4. Nabothian cysts on the cervix.  Menstrual History: OB History    Grav Para Term Preterm Abortions TAB SAB Ect Mult Living   5 2 2  3 3    2        Patient's last menstrual period was 06/08/2011.    The following portions of the patient's history were reviewed and updated as appropriate: allergies, current medications, past family history, past medical history, past social history, past surgical history and problem list.  Review of Systems Pertinent items are noted in HPI.    Objective:    BP 126/88  Pulse 75  Temp(Src) 96.8 F (36 C) (Oral)  Ht 5\' 3"  (1.6 m)  Wt 100 lb 8 oz (45.587 kg)  BMI 17.80 kg/m2  LMP 06/08/2011  General:   alert, cooperative and appears stated age  Skin:    rash noted on extremities and abdomen  Neck:  supple, symmetrical, trachea midline and thyroid not enlarged, symmetric, no tenderness/mass/nodules  Abdomen:  abnormal findings:  tender on left side with palpation  Pelvic:   cervix normal in appearance, external genitalia normal, no cervical motion tenderness,  rectovaginal septum normal, vagina normal without discharge and uterus enlarged and tender with palpation.     Patient given informed consent, signed copy in the chart, time out was performed. Appropriate time out taken. . The patient was placed in the lithotomy position and the cervix brought into view with sterile speculum.  Portio of cervix cleansed x 2 with betadine swabs.  The uterus was sounded for depth of 8cm. A pipelle was introduced to into the uterus, suction created,  and an endometrial sample was obtained. All equipment was removed and accounted for.  The patient tolerated the procedure well.    Patient given post procedure instructions. The patient will return in 2 weeks for results.   Assessment:    dysfunctional uterine bleeding and uterine fibroids  Right Ovarian Cyst Plan:    Endometrial biopsy - see separate procedure note.  GC/CT urine Wet prep Follow-up in two weeks for surgical consult (per pt) and results review.  [facility]

## 2011-06-12 LAB — GC/CHLAMYDIA PROBE AMP, URINE
Chlamydia, Swab/Urine, PCR: NEGATIVE
GC Probe Amp, Urine: NEGATIVE

## 2011-06-12 LAB — WET PREP, GENITAL
Trich, Wet Prep: NONE SEEN
Yeast Wet Prep HPF POC: NONE SEEN

## 2011-07-18 ENCOUNTER — Encounter: Payer: Self-pay | Admitting: Obstetrics & Gynecology

## 2011-07-18 ENCOUNTER — Ambulatory Visit (INDEPENDENT_AMBULATORY_CARE_PROVIDER_SITE_OTHER): Payer: Self-pay | Admitting: Obstetrics & Gynecology

## 2011-07-18 VITALS — BP 130/79 | HR 73 | Temp 97.0°F | Ht 63.0 in | Wt 103.1 lb

## 2011-07-18 DIAGNOSIS — N939 Abnormal uterine and vaginal bleeding, unspecified: Secondary | ICD-10-CM

## 2011-07-18 DIAGNOSIS — N938 Other specified abnormal uterine and vaginal bleeding: Secondary | ICD-10-CM

## 2011-07-18 DIAGNOSIS — D219 Benign neoplasm of connective and other soft tissue, unspecified: Secondary | ICD-10-CM | POA: Insufficient documentation

## 2011-07-18 DIAGNOSIS — N926 Irregular menstruation, unspecified: Secondary | ICD-10-CM

## 2011-07-18 NOTE — Patient Instructions (Signed)

## 2011-07-18 NOTE — Progress Notes (Signed)
History:  51 y.o. [id number] here today for discussion of results for evaluation of menorrhagia. She was evaluated with endometrial biopsy and pelvic ultrasound.  She still reports continued menorrhagia.  No other symptoms.  The following portions of the patient's history were reviewed and updated as appropriate: allergies, current medications, past family history, past medical history, past social history, past surgical history and problem list.  Review of Systems:  Pertinent items are noted in HPI.  Objective:  Physical Exam Blood pressure 130/79, pulse 73, temperature 97 F (36.1 C), height 5\' 3"  (1.6 m), weight 103 lb 1.6 oz (46.766 kg), last menstrual period 06/25/2011. Deferred  Labs and Imaging 05/02/11  TRANSABDOMINAL AND TRANSVAGINAL ULTRASOUND OF PELVIS 05/02/2011:   Findings: Uterus: Retroverted. Mildly enlarged measuring approximately 9.1 x 8.0 x 6.9 cm, containing a posterior fundal fibroid measuring approximately 7.9 x 5.3 x 6.6 cm. The fibroid is submucosal, but does not significantly displace the endometrium. Nabothian cysts are noted on the uterine cervix. Endometrium: Normal and trilaminar in appearance measuring approximately 8 mm in thickness. No endometrial fluid or mass. Right ovary: Upper normal in size measuring approximate 4.6 x 5.1 x 3.2 cm. Complex, septated cyst with a debris level measuring approximately 4.0 x 3.7 x 2.8 cm. No solid mass. Left ovary: Normal in size measuring approximate 1.3 x 1.6 x 1.7 cm. Simple follicular cyst measuring approximate 1.3 x 1.2 x 1.1 cm. No dominant cyst or solid mass. Other findings: No free pelvic fluid.  IMPRESSION: 1. Endometrium upper normal in thickness at 8 mm without evidence of endometrial fluid or mass. 2. Approximate 8 cm posterior fundal uterine fibroid. Retroverted uterus. 3. Complex, septated cyst with a debris level involving the right ovary approximating 4 cm. No free pelvic fluid to confirm cyst rupture. 4. Nabothian cysts on  the cervix.  Assessment & Plan:  Discussed management options for menorrhagia and fibroids including oral Provera, Depo Provera, Mirena IUD, Depo Lupron, endometrial ablation (Novasure/Hydrothermal Ablation/Thermachoice), myomectomy or hysterectomy as definitive surgical management.  Discussed risks and benefits of each method. Patient desires to avoid surgery for now; wants to try Mirena.  Patient education information given to her to review at home.   Bleeding precautions reviewed.  She will return to clinic soon for Mirena placement.

## 2011-08-08 ENCOUNTER — Telehealth: Payer: Self-pay | Admitting: *Deleted

## 2011-08-08 NOTE — Telephone Encounter (Signed)
Patient called and left a message stating "they were supposed to schedule me for an IUD insertion appointment but they haven't called me back- please call me.   Verified patient already has appt scheduled in computer and called patient and gave her the appointment- patient asked to move to another date- transferred patient to front desk to change appt.

## 2011-08-22 ENCOUNTER — Telehealth: Payer: Self-pay | Admitting: Obstetrics and Gynecology

## 2011-08-22 ENCOUNTER — Encounter: Payer: Self-pay | Admitting: Obstetrics & Gynecology

## 2011-08-22 ENCOUNTER — Ambulatory Visit (INDEPENDENT_AMBULATORY_CARE_PROVIDER_SITE_OTHER): Payer: Self-pay | Admitting: Obstetrics & Gynecology

## 2011-08-22 VITALS — BP 115/75 | HR 86 | Temp 98.5°F | Ht 62.0 in | Wt 98.9 lb

## 2011-08-22 DIAGNOSIS — N938 Other specified abnormal uterine and vaginal bleeding: Secondary | ICD-10-CM

## 2011-08-22 DIAGNOSIS — Z30430 Encounter for insertion of intrauterine contraceptive device: Secondary | ICD-10-CM

## 2011-08-22 DIAGNOSIS — N949 Unspecified condition associated with female genital organs and menstrual cycle: Secondary | ICD-10-CM

## 2011-08-22 DIAGNOSIS — N925 Other specified irregular menstruation: Secondary | ICD-10-CM

## 2011-08-22 LAB — POCT PREGNANCY, URINE: Preg Test, Ur: NEGATIVE

## 2011-08-22 MED ORDER — LEVONORGESTREL 20 MCG/24HR IU IUD
INTRAUTERINE_SYSTEM | Freq: Once | INTRAUTERINE | Status: AC
  Administered 2011-08-22: 1 via INTRAUTERINE

## 2011-08-22 NOTE — Telephone Encounter (Signed)
Patient called in regard of IUD appt. She thought it was yesterday and confirmed with her that it is for today at 4:15pm. States she will call back if she needs to re-schedule.

## 2011-08-22 NOTE — Progress Notes (Signed)
  Subjective:    Patient ID: Katie Montgomery, female    DOB: Mar 22, 1960, 51 y.o.   MRN: [id number]  HPI She is here for a Mirena insertion to help treat her DUB due to fibroids.  Review of Systems     Objective:   Physical Exam A UPT was negative. We discussed the Mirena and the consent was signed A bimanual exam revealed an AV uterus with a 5 cm posterior fibroid Her cervix was prepped with betadine and a single tooth tenaculum was placed on the anterior lip of the cervix. The Mirena was placed easily and the strings were cut to 3 cm.       Assessment & Plan:  DUB- Mirena Schedule a mammogram at next visit. She didn't want to schedule it today.

## 2011-09-04 ENCOUNTER — Telehealth: Payer: Self-pay | Admitting: *Deleted

## 2011-09-04 DIAGNOSIS — N949 Unspecified condition associated with female genital organs and menstrual cycle: Secondary | ICD-10-CM

## 2011-09-04 NOTE — Telephone Encounter (Signed)
Pt left message stating that she had IUD placed 2 wks ago and is still having pelvic pain. She is having difficulty walking and functioning because of the pain. She is considering having the IUD removed.  I returned pt's call and advised her that she first needs an ultrasound to determine if the IUD is in the proper place within the uterus and to check if anything has changed with her fibroids. Then, we will see her at the clinic for the doctor to formulate plan of care. I scheduled her [place] for 6/19 @ 1345. Pt agreed to appt and will come to clinic following [place].  I advised pt to take ibuprofen 600 mg every 6hrs for the next 24 hrs. Pt states that she cannot take too much ibuprofen because it makes her ears ring. I then recommended 400 mg every 4-6 hrs with food.  Pt voiced understanding.

## 2011-09-05 ENCOUNTER — Ambulatory Visit (INDEPENDENT_AMBULATORY_CARE_PROVIDER_SITE_OTHER): Payer: Self-pay | Admitting: Advanced Practice Midwife

## 2011-09-05 ENCOUNTER — Encounter: Payer: Self-pay | Admitting: Advanced Practice Midwife

## 2011-09-05 ENCOUNTER — Ambulatory Visit (HOSPITAL_COMMUNITY)
Admission: RE | Admit: 2011-09-05 | Discharge: 2011-09-05 | Disposition: A | Discharge status: Home / Self Care | Payer: Self-pay | Source: Ambulatory Visit | Attending: Family Medicine | Admitting: Family Medicine

## 2011-09-05 VITALS — BP 128/85 | HR 73 | Temp 97.1°F | Ht 62.0 in | Wt 98.6 lb

## 2011-09-05 DIAGNOSIS — Z30432 Encounter for removal of intrauterine contraceptive device: Secondary | ICD-10-CM

## 2011-09-05 DIAGNOSIS — R350 Frequency of micturition: Secondary | ICD-10-CM

## 2011-09-05 DIAGNOSIS — Z30431 Encounter for routine checking of intrauterine contraceptive device: Secondary | ICD-10-CM | POA: Insufficient documentation

## 2011-09-05 DIAGNOSIS — T839XXA Unspecified complication of genitourinary prosthetic device, implant and graft, initial encounter: Secondary | ICD-10-CM

## 2011-09-05 DIAGNOSIS — R1084 Generalized abdominal pain: Secondary | ICD-10-CM

## 2011-09-05 DIAGNOSIS — D251 Intramural leiomyoma of uterus: Secondary | ICD-10-CM | POA: Insufficient documentation

## 2011-09-05 DIAGNOSIS — N949 Unspecified condition associated with female genital organs and menstrual cycle: Secondary | ICD-10-CM | POA: Insufficient documentation

## 2011-09-05 LAB — POCT URINALYSIS DIP (DEVICE)
Ketones, ur: NEGATIVE mg/dL
Protein, ur: NEGATIVE mg/dL
Specific Gravity, Urine: <=1.005 (ref 1.005–1.030)

## 2011-09-05 NOTE — Progress Notes (Signed)
  Subjective:    Patient ID: Katie Montgomery, female    DOB: Aug 08, 1960, 51 y.o.   MRN: [id number]  HPI; Pt reports constant abd cramping and having difficulty walking due to pain since Mirena insertion for DUB on 08/22/11. She denies fever, chills, heavy bleeding. Pain improves w/ Ibuprofen, but not adequately. Also reports frequency of urination, but thinks it is due to increased fluids. Denies dysuria, hematuria, flank pain, urgency.     Review of Systems: Pertinent findings in HPI.     Objective:   Physical Exam BP 128/85  Pulse 73  Temp 97.1 F (36.2 C) (Oral)  Ht 5\' 2"  (1.575 m)  Wt 98 lb 9.6 oz (44.725 kg)  BMI 18.03 kg/m2  LMP 08/24/2011  [place] 09/05/11 shows properly positioned IUD.   [place]: moderate blood.  After discussing management options of expectant management while awaiting urine culture results vs removal, pt requests removal of IUD.  Procedure Note: Time-out performed. Graves speculum inserted. Small amount of DRB in vault. Anterior cervix visualized. Strings grasped w/ ring forceps and IUD removed easily w/ no additional bleeding. Pt tolerated procedure well stating that she already felt better.    Assessment & Plan:   1. IUD complication   F/U in 4 weeks to discuss DUB management options and reassess pain.   [place], CNM 09/05/2011 4:08 PM

## 2011-09-06 LAB — URINE CULTURE: Colony Count: NO GROWTH

## 2011-10-03 ENCOUNTER — Ambulatory Visit: Payer: Self-pay | Admitting: Obstetrics & Gynecology

## 2013-08-12 IMAGING — US US TRANSVAGINAL NON-OB
1 series · 13 of 25 positions shown · non-contrast
Comparison: None.

CLINICAL DATA: Dysfunctional uterine bleeding.  History of ovarian
cyst removal.  G4 P2.  Unknown LMP.

TRANSABDOMINAL AND TRANSVAGINAL ULTRASOUND OF PELVIS 05/02/2011:
TECHNIQUE: Both transabdominal and transvaginal ultrasound
examinations of the pelvis were performed. Transabdominal technique
was performed for global imaging of the pelvis including uterus,
ovaries, adnexal regions, and pelvic cul-de-sac.

[Series 1: us transvaginal non-ob · 0.28mm/px · 13 of 87 slices shown]
[im 1/87]
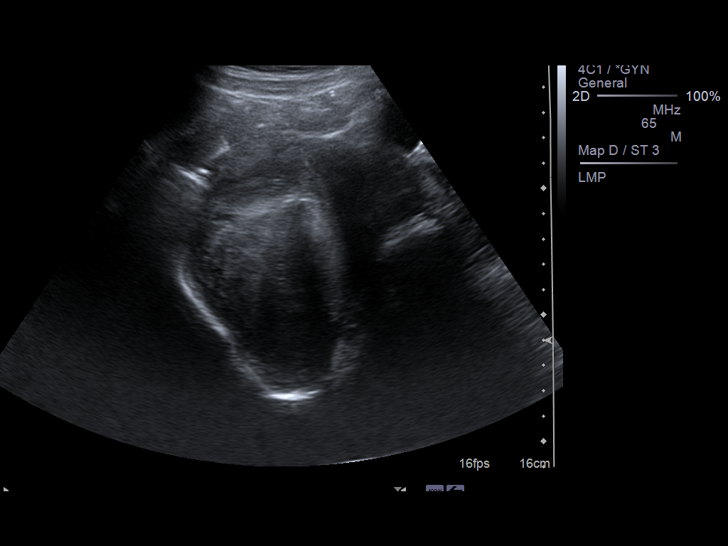
[im 8/87]
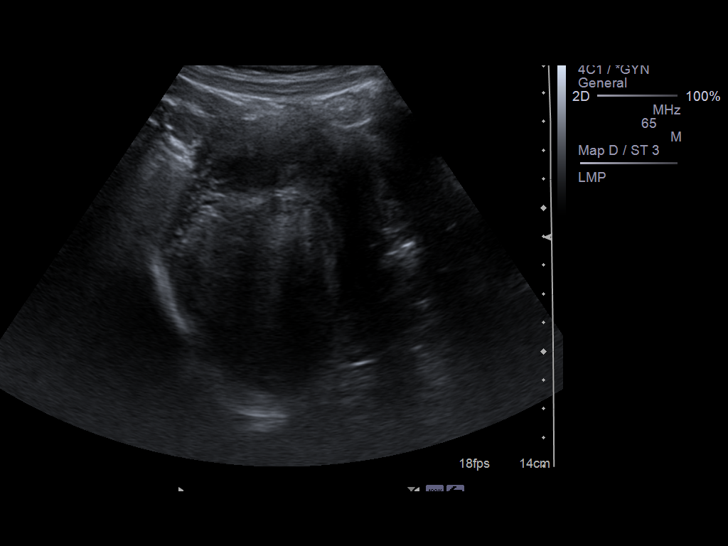
[im 15/87]
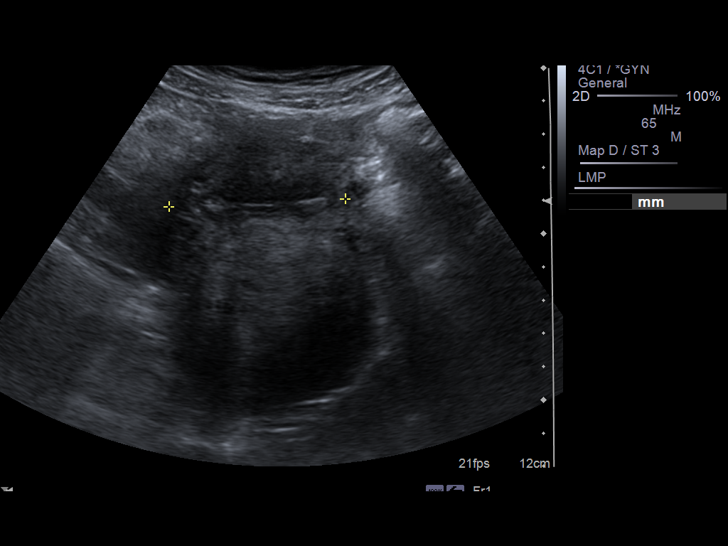
[im 22/87]
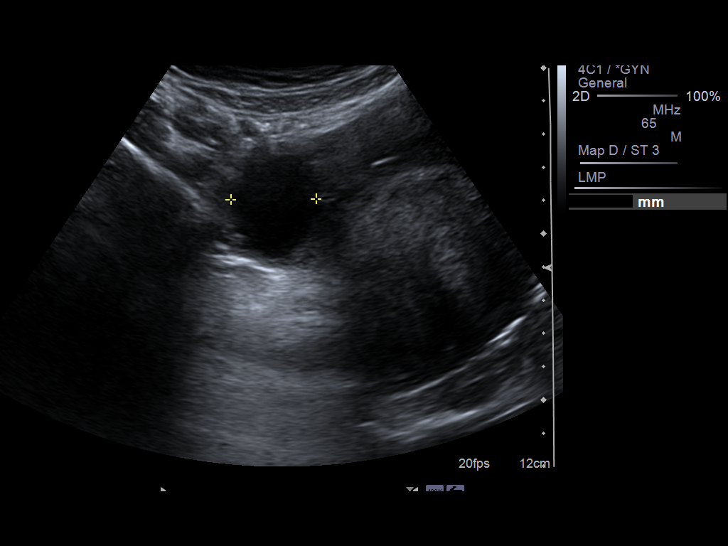
[im 29/87]
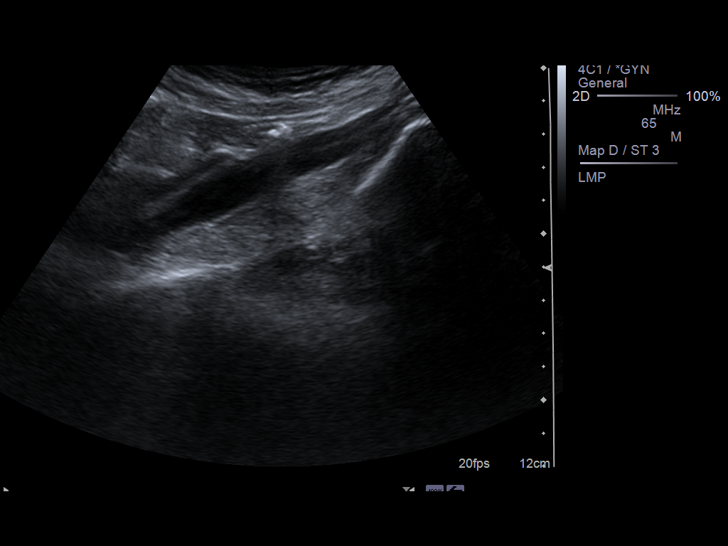
[im 36/87]
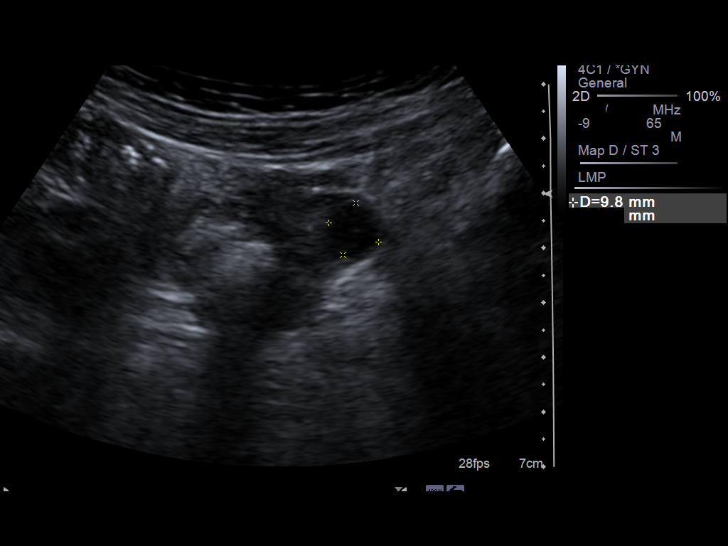
[im 44/87]
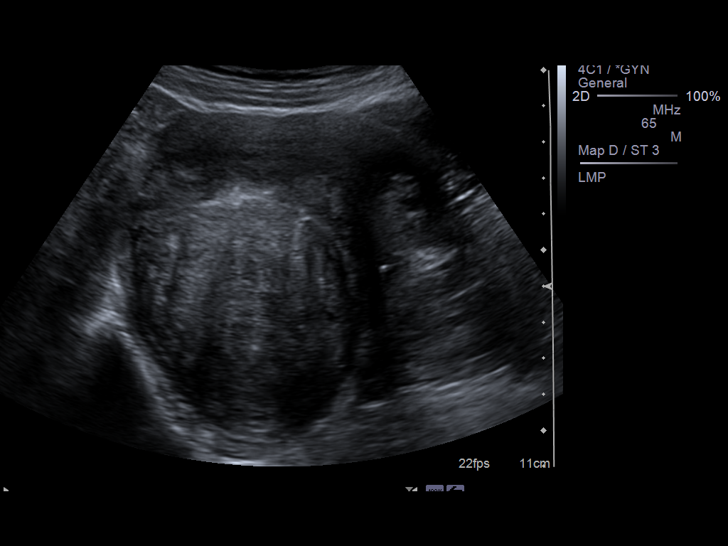
[im 51/87]
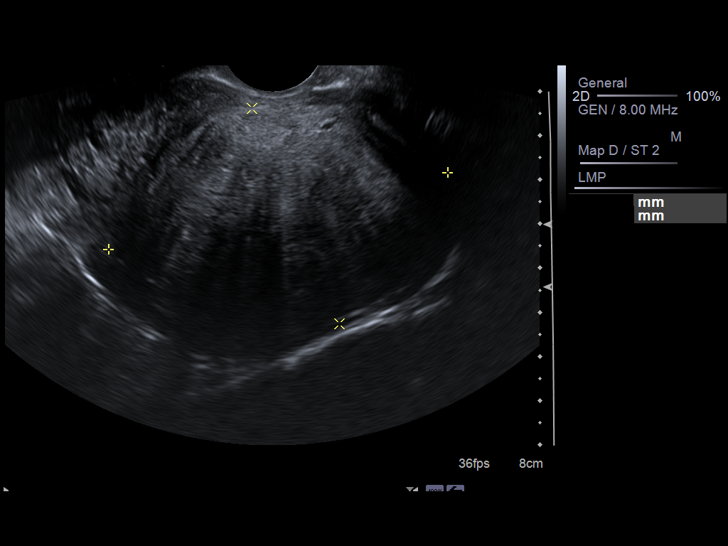
[im 58/87]
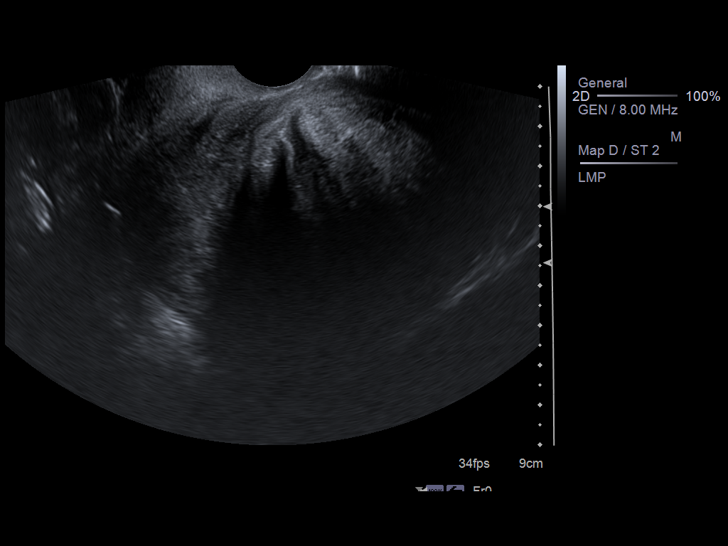
[im 65/87]
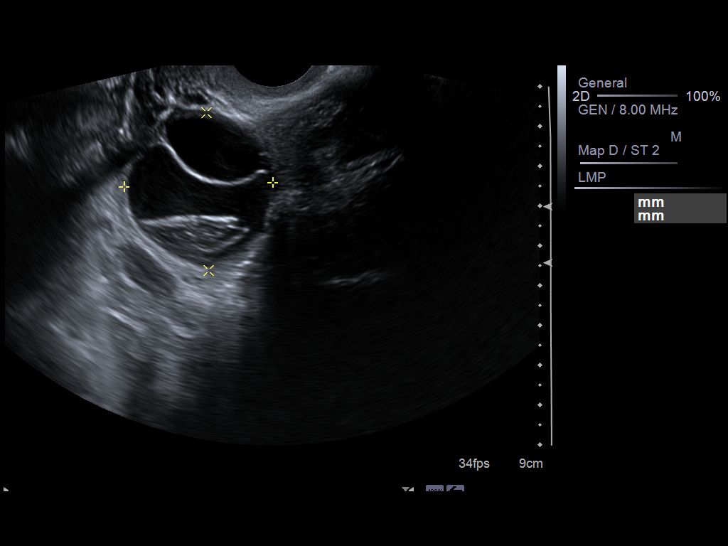
[im 72/87]
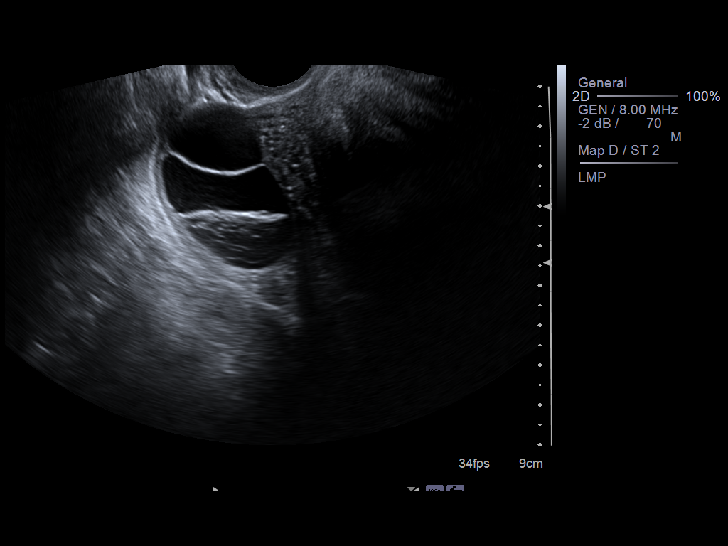
[im 79/87]
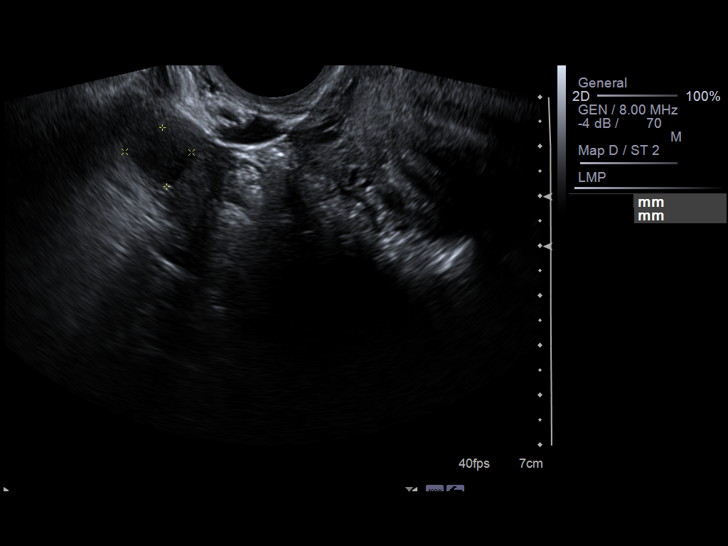
[im 87/87]
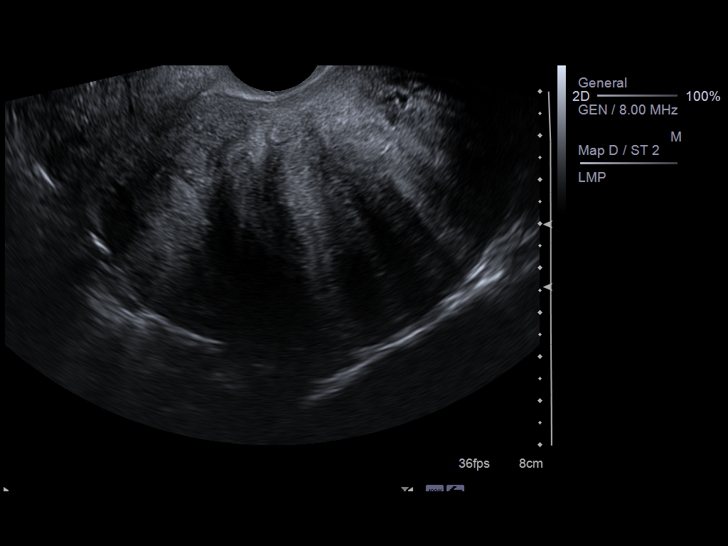

[13 of 25 positions shown; findings below may reference images not displayed]

It was necessary to proceed with endovaginal exam following the
transabdominal exam to visualize the uterus.
FINDINGS: Uterus: Retroverted.  Mildly enlarged measuring approximately 9.1 x
8.0 x 6.9 cm, containing a posterior fundal fibroid measuring
approximately 7.9 x 5.3 x 6.6 cm.  The fibroid is submucosal, but
does not significantly displace the endometrium.  Nabothian cysts
are noted on the uterine cervix.

Endometrium: Normal and trilaminar in appearance measuring
approximately 8 mm in thickness.  No endometrial fluid or mass.

Right ovary:  Upper normal in size measuring approximate 4.6 x
x 3.2 cm.  Complex, septated cyst with a debris level measuring
approximately 4.0 x 3.7 x 2.8 cm.  No solid mass.

Left ovary: Normal in size measuring approximate 1.3 x 1.6 x
cm.  Simple follicular cyst measuring approximate 1.3 x 1.2 x
cm.  No dominant cyst or solid mass.

Other findings: No free pelvic fluid.
IMPRESSION: 1.  Endometrium upper normal in thickness at 8 mm without evidence
of endometrial fluid or mass.
2.  Approximate 8 cm posterior fundal uterine fibroid.  Retroverted
uterus.
3.  Complex, septated cyst with a debris level involving the right
ovary approximating 4 cm.  No free pelvic fluid to confirm cyst
rupture.
4.  Nabothian cysts on the cervix.

Follow-up pelvic ultrasound in a different part of the menstrual
cycle (in 6 weeks or 10 weeks) is recommended to follow up the
right ovarian complex cyst.

## 2013-12-16 IMAGING — US US TRANSVAGINAL NON-OB
1 series · 13 of 25 positions shown · non-contrast
Comparison: 05/02/2011

CLINICAL DATA: Pelvic pain, IUD placed 2 weeks ago

TRANSABDOMINAL AND TRANSVAGINAL ULTRASOUND OF PELVIS
TECHNIQUE: Both transabdominal and transvaginal ultrasound
examinations of the pelvis were performed. Transabdominal technique
was performed for global imaging of the pelvis including uterus,
ovaries, adnexal regions, and pelvic cul-de-sac.
It was necessary to proceed with endovaginal exam following the
transabdominal exam to visualize the ovaries and endometrium, not
seen transabdominally.

[Series 1: us pelvis complete · 13 of 55 slices shown]
[im 1/55]
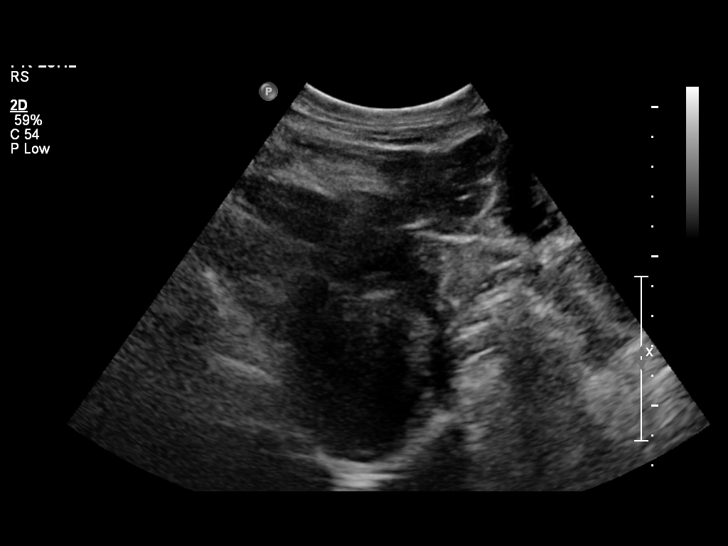
[im 5/55]
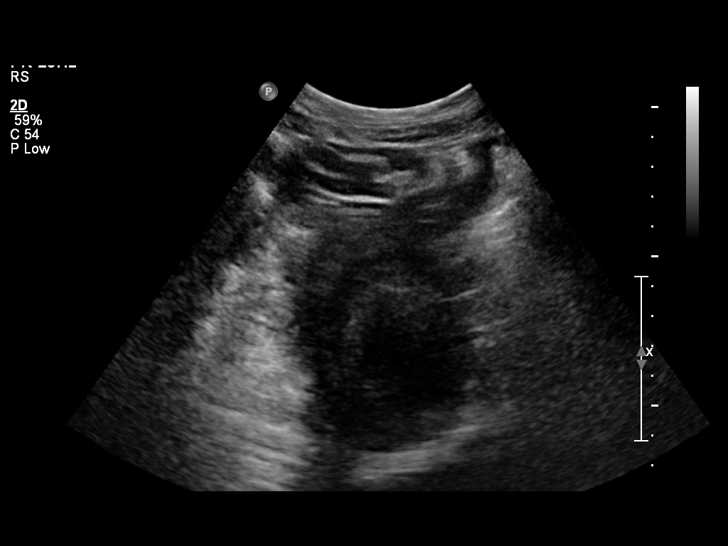
[im 10/55]
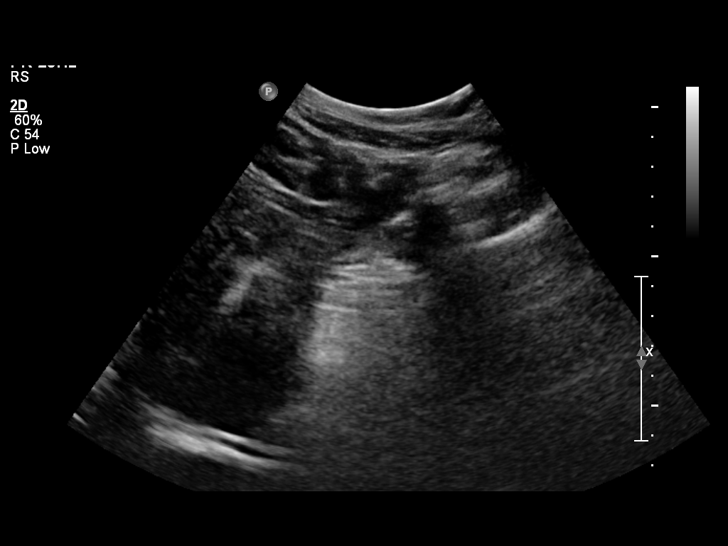
[im 14/55]
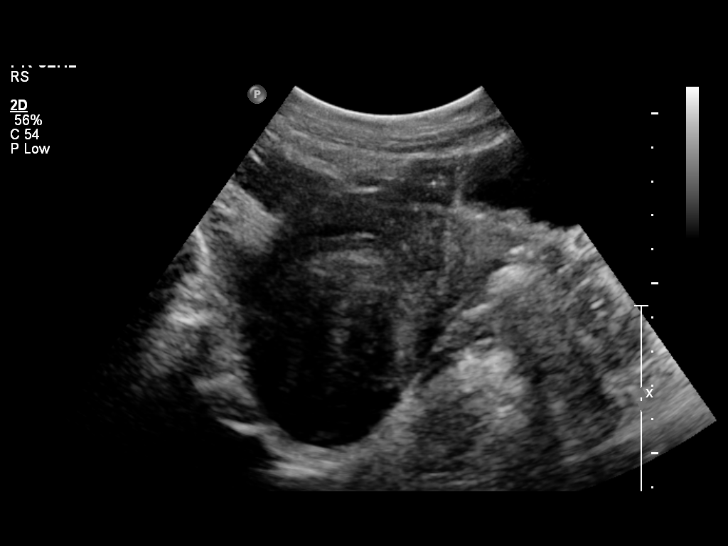
[im 19/55]
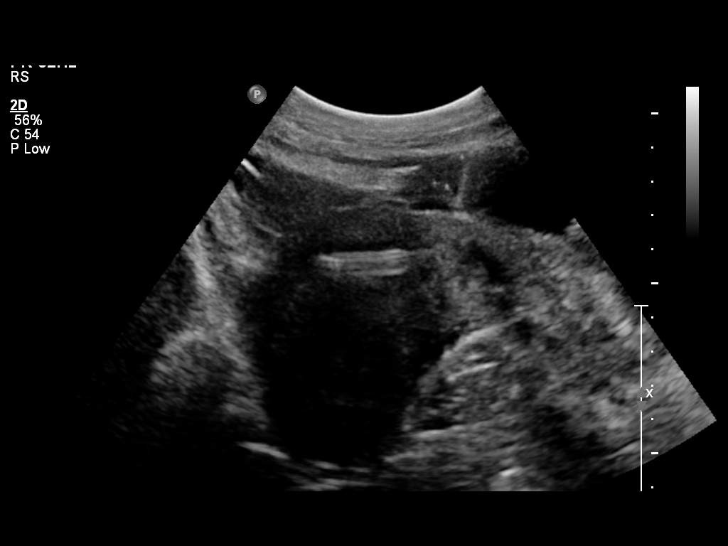
[im 23/55]
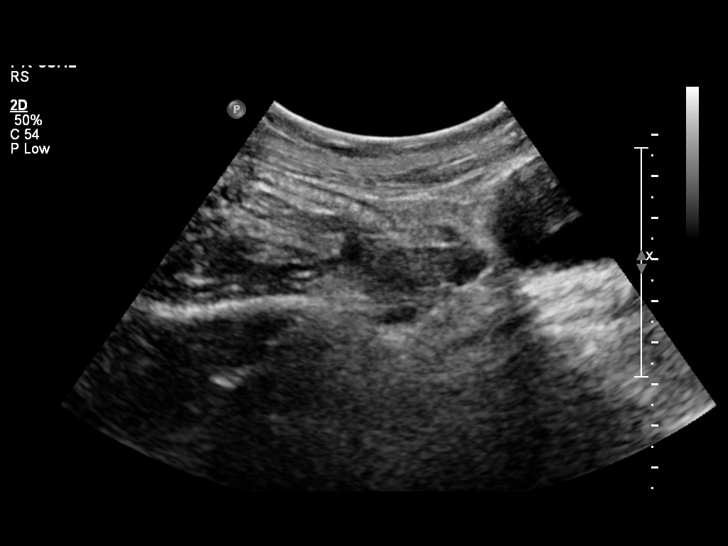
[im 28/55]
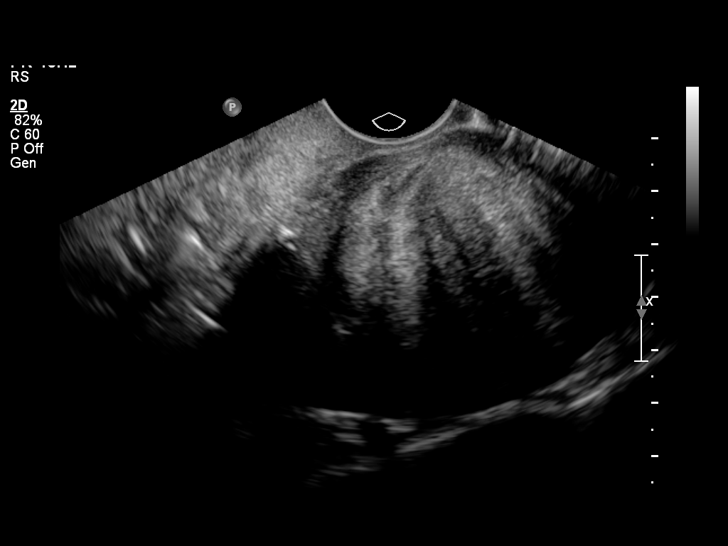
[im 32/55]
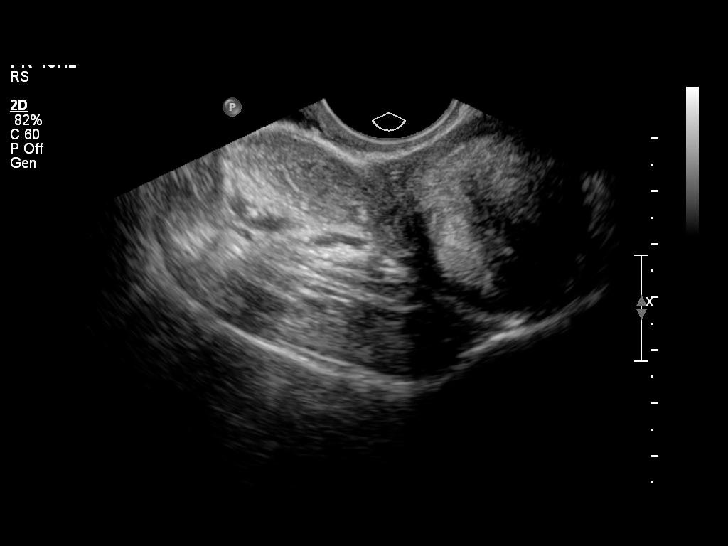
[im 37/55]
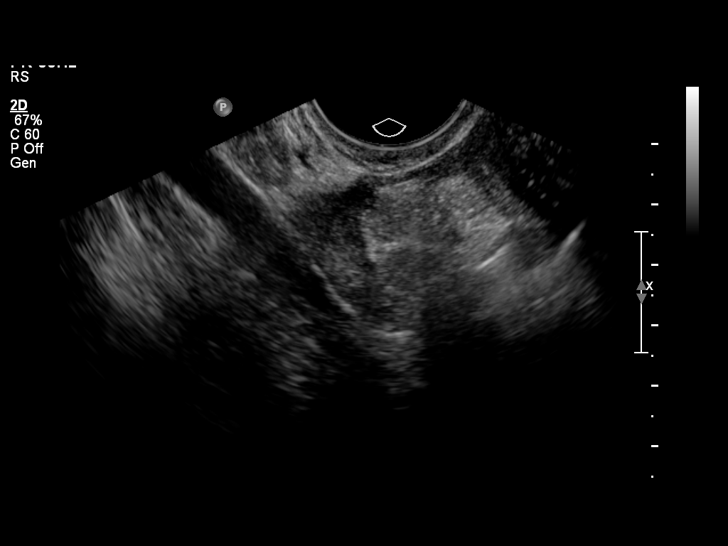
[im 41/55]
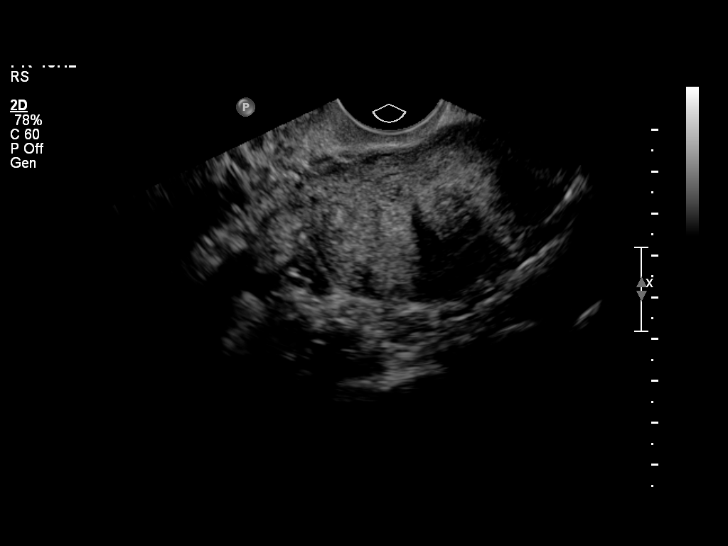
[im 46/55]
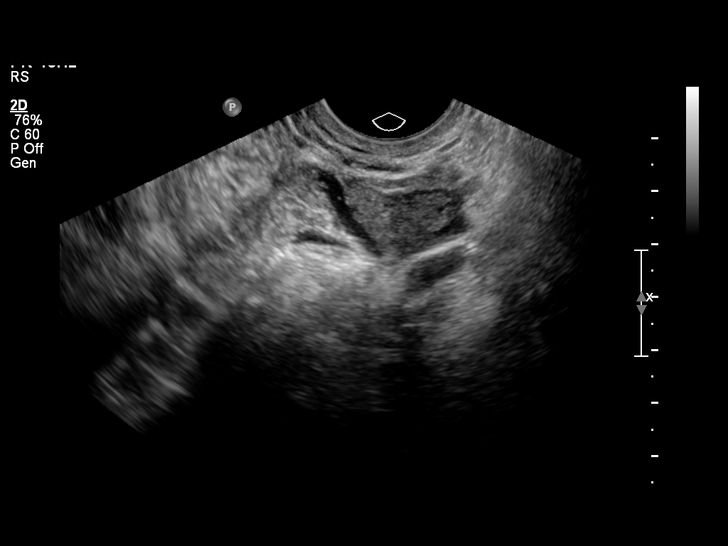
[im 50/55]
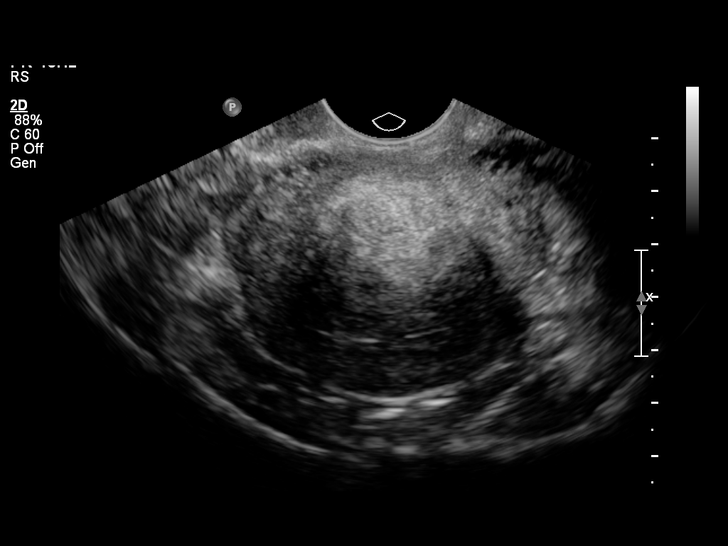
[im 55/55]
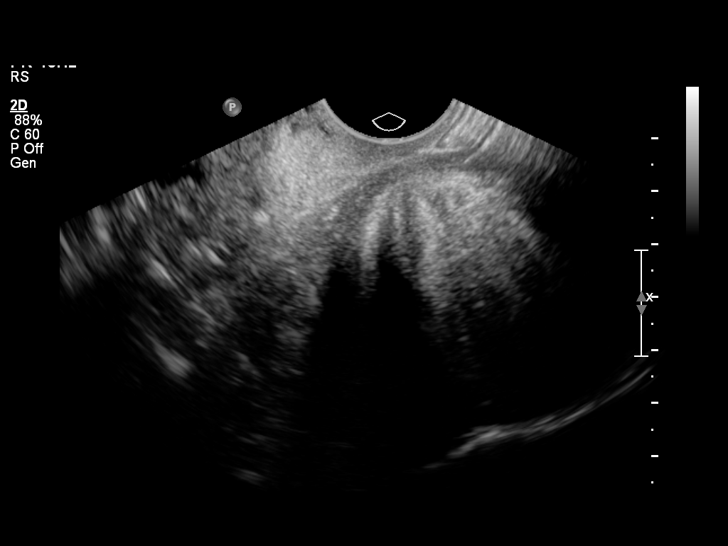

[13 of 25 positions shown; findings below may reference images not displayed]

FINDINGS: Uterus: Retroverted, retroflexed.  8.0 x 6.0 x 5.7 cm.  Posterior
uterine body/fundus intramural fibroid displaces the endometrial
stripe anteriorly, measuring overall 5.5 x 5.0 x 4.5 cm.

Endometrium: IUD appropriately located within the uterine body
endometrial canal, but some detail obscured by overlying fibroid.
Endometrial stripe not measurable separate from the IUD.

Right ovary:  2.2 x 1.3 x 1.4 cm.  Normal.

Left ovary: 2.5 x 2.1 x 1.5 cm.  Normal.

Other findings: No free fluid
IMPRESSION: Posterior fundal fibroid again noted, possibly slightly smaller
than previously.IUD appropriately located within the uterine body
endometrial canal..

## 2014-01-18 ENCOUNTER — Encounter: Payer: Self-pay | Admitting: Advanced Practice Midwife

## 2020-04-22 ENCOUNTER — Other Ambulatory Visit: Payer: Self-pay

## 2020-04-22 ENCOUNTER — Ambulatory Visit (HOSPITAL_COMMUNITY)
Admission: EM | Admit: 2020-04-22 | Discharge: 2020-04-22 | Disposition: A | Discharge status: Home / Self Care | Payer: No Payment, Other | Attending: Psychiatry | Admitting: Psychiatry

## 2020-04-22 DIAGNOSIS — F331 Major depressive disorder, recurrent, moderate: Secondary | ICD-10-CM | POA: Insufficient documentation

## 2020-04-22 MED ORDER — CITALOPRAM HYDROBROMIDE 20 MG PO TABS
20.0000 mg | ORAL_TABLET | Freq: Every day | ORAL | Status: DC
  Filled 2020-04-22 (×2): qty 7

## 2020-04-22 MED ORDER — CITALOPRAM HYDROBROMIDE 20 MG PO TABS: 20.0000 mg | ORAL_TABLET | Freq: Every day | ORAL | 1 refills | Status: DC

## 2020-04-22 MED ORDER — HYDROXYZINE HCL 25 MG PO TABS
25.0000 mg | ORAL_TABLET | Freq: Three times a day (TID) | ORAL | Status: DC
  Filled 2020-04-22 (×2): qty 7

## 2020-04-22 MED ORDER — HYDROXYZINE PAMOATE 25 MG PO CAPS: 25.0000 mg | ORAL_CAPSULE | Freq: Every day | ORAL | 1 refills | Status: DC | PRN

## 2020-04-22 NOTE — ED Notes (Signed)
Discharge instructions provided and Pt stated understanding. Prescriptions and samples given. No personal belongings to be returned from a locker. Pt alert, orient and ambulatory. Safety maintained.

## 2020-04-22 NOTE — ED Provider Notes (Signed)
[facility] Medical Screening Exam  Patient Name: Katie Montgomery MRN: [id number] Date of Evaluation: 04/22/20 Chief Complaint:   Diagnosis:  Final diagnoses:  MDD (major depressive disorder), recurrent episode, moderate ([place])     History of Present illness: Katie Montgomery is a 60 y.o. female.  Patient presents to the [facility] voluntarily as a walk-in accompanied by her cousin.  Patient denies any suicidal homicidal ideations and denies any hallucinations.  Patient reports that she is prescribed citalopram and Vistaril and that she has been on it for about 6 years, however she lost contact with her provider at [facility] and she has now been out of her medications for about a week and a half.  She states that she has been trying to get in to get an appointment at the [facility] however the next appointment that she can get is going to be approximately the end of February or the middle of March.  Patient is requesting to restart her medications so that she can make it to her next appointment.  Patient complains of having increased anxiety and some depression symptoms however she states that they are not severe.  After reviewing patient's medication list she is prescribed Vistaril 25 mg p.o. daily as needed and citalopram 20 mg p.o. daily.  Patient has been provided with a prescription for her medications that have been E prescribed to pharmacy of choice and she has been provided with 7-day samples of her medications as well.  Patient is instructed to maintain her appointment and to also return sooner if needed for walk-in appointments and the hours of operation have been provided for the [facility].  Psychiatric Specialty Exam  Presentation  General Appearance:Appropriate for Environment; Casual  Eye Contact:Good  Speech:Clear and Coherent; Normal Rate  Speech Volume:Normal  Handedness:Right   Mood and Affect  Mood:Anxious; Euthymic  Affect:Appropriate; Congruent   Thought Process   Thought Processes:Coherent  Descriptions of Associations:Intact  Orientation:Full (Time, Place and Person)  Thought Content:WDL  Hallucinations:None  Ideas of Reference:None  Suicidal Thoughts:No  Homicidal Thoughts:No   Sensorium  Memory:Immediate Good; Recent Good; Remote Good  Judgment:Good  Insight:Good   Executive Functions  Concentration:Good  Attention Span:Good  [place]  Language:Good   Psychomotor Activity  Psychomotor Activity:Normal   Assets  Assets:Communication Skills; Desire for Improvement; Housing; Transportation; Resilience   Sleep  Sleep:Fair  Number of hours: No data recorded  Physical Exam: Physical Exam Vitals and nursing note reviewed.  Constitutional:      Appearance: She is well-developed.  HENT:     Head: Normocephalic.  Eyes:     Pupils: Pupils are equal, round, and reactive to light.  Cardiovascular:     Rate and Rhythm: Normal rate.  Pulmonary:     Effort: Pulmonary effort is normal.  Musculoskeletal:        General: Normal range of motion.  Neurological:     Mental Status: She is alert and oriented to person, place, and time.    Review of Systems  Constitutional: Negative.   HENT: Negative.   Eyes: Negative.   Respiratory: Negative.   Cardiovascular: Negative.   Gastrointestinal: Negative.   Genitourinary: Negative.   Musculoskeletal: Negative.   Skin: Negative.   Neurological: Negative.   Endo/Heme/Allergies: Negative.   Psychiatric/Behavioral: The patient is nervous/anxious.    There were no vitals taken for this visit. There is no height or weight on file to calculate BMI.  Musculoskeletal: Strength & Muscle  Tone: within normal limits Gait & Station: normal Patient leans: N/A   [facility] MSE Discharge Disposition for Follow up and Recommendations: Based on my evaluation the patient does not appear to have an emergency medical condition and can be discharged with  resources and follow up care in outpatient services for Medication Management and [place], FNP 04/22/2020, 12:54 PM

## 2020-04-22 NOTE — Discharge Instructions (Signed)

## 2020-05-30 ENCOUNTER — Encounter (HOSPITAL_COMMUNITY): Payer: Self-pay | Admitting: Psychiatry

## 2020-05-30 ENCOUNTER — Other Ambulatory Visit: Payer: Self-pay

## 2020-05-30 ENCOUNTER — Ambulatory Visit (INDEPENDENT_AMBULATORY_CARE_PROVIDER_SITE_OTHER): Payer: No Payment, Other | Admitting: Psychiatry

## 2020-05-30 VITALS — BP 171/94 | HR 96 | Ht 62.0 in | Wt 120.0 lb

## 2020-05-30 DIAGNOSIS — F418 Other specified anxiety disorders: Secondary | ICD-10-CM

## 2020-05-30 DIAGNOSIS — F3342 Major depressive disorder, recurrent, in full remission: Secondary | ICD-10-CM | POA: Insufficient documentation

## 2020-05-30 DIAGNOSIS — F3341 Major depressive disorder, recurrent, in partial remission: Secondary | ICD-10-CM | POA: Insufficient documentation

## 2020-05-30 MED ORDER — HYDROXYZINE HCL 25 MG PO TABS: 25.0000 mg | ORAL_TABLET | Freq: Every evening | ORAL | 2 refills | Status: DC

## 2020-05-30 MED ORDER — CITALOPRAM HYDROBROMIDE 20 MG PO TABS: 20.0000 mg | ORAL_TABLET | Freq: Every day | ORAL | 2 refills | Status: DC
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
## 2020-08-29 ENCOUNTER — Ambulatory Visit (INDEPENDENT_AMBULATORY_CARE_PROVIDER_SITE_OTHER): Payer: No Payment, Other | Admitting: Psychiatry

## 2020-08-29 ENCOUNTER — Other Ambulatory Visit: Payer: Self-pay

## 2020-08-29 ENCOUNTER — Encounter (HOSPITAL_COMMUNITY): Payer: Self-pay | Admitting: Psychiatry

## 2020-08-29 ENCOUNTER — Telehealth (HOSPITAL_COMMUNITY): Payer: Self-pay | Admitting: *Deleted

## 2020-08-29 VITALS — BP 168/82 | HR 74 | Ht 62.0 in | Wt 116.0 lb

## 2020-08-29 DIAGNOSIS — F339 Major depressive disorder, recurrent, unspecified: Secondary | ICD-10-CM | POA: Diagnosis not present

## 2020-08-29 MED ORDER — HYDROXYZINE HCL 25 MG PO TABS
25.0000 mg | ORAL_TABLET | Freq: Every evening | ORAL | 2 refills | Status: DC
Start: 2020-08-29 — End: 2020-11-22

## 2020-08-29 MED ORDER — CITALOPRAM HYDROBROMIDE 20 MG PO TABS
20.0000 mg | ORAL_TABLET | Freq: Every day | ORAL | 2 refills | Status: DC
Start: 2020-08-29 — End: 2020-11-29

## 2020-08-29 NOTE — Telephone Encounter (Signed)
Ok

## 2020-08-29 NOTE — Telephone Encounter (Signed)
Patient is out of Hydroxyzine.

## 2020-08-29 NOTE — Progress Notes (Signed)
[place] OP Progress Note   Patient Identification: Katie Montgomery MRN:  [id number] Date of Evaluation:  08/29/2020   Chief Complaint:  " I am feeling fine."   Visit Diagnosis:    ICD-10-CM   1. Major depressive disorder, recurrent episode with anxious distress (HCC)  F33.9 citalopram (CELEXA) 20 MG tablet    hydrOXYzine (ATARAX/VISTARIL) 25 MG tablet      History of Present Illness: Patient stated she is doing well overall.  She stated that she stays busy well taking care of her grandkids who she sees sometimes.  She stated that her 2 children live nearby and she is always trying to help them as much as she can.  She also informed that her 20+-year-old mother also lives close by and she is always there to help her. She stated that her mood and anxiety have been stable.  She has been sleeping well.  She stated that there were a few weeks that she can sleep well at night and then she started waking up earlier during the day which helped her regulate her sleep cycle. She denies any specific plans for the summer. She denies any new issues or concerns today.   Past Psychiatric History: Depression, anxiety, alcohol use disorder now in remission.  Has been sober for 15+ years.  Previous Psychotropic Medications: Yes -Celexa, Lexapro, hydroxyzine  Substance Abuse History in the last 12 months:  No. Past hx of alcohol use disorder, in remission for 15 plus years  Consequences of Substance Abuse: NA  Past Medical History:  Past Medical History:  Diagnosis Date   Allergy    Anxiety    Depression    Hypertension     Past Surgical History:  Procedure Laterality Date   APPENDECTOMY     cyst on ovary      Family Psychiatric History: denied  Family History:  Family History  Problem Relation Age of Onset   Arthritis Mother    Diabetes Father    Diabetes Maternal Grandmother     Social History:   Social History   Socioeconomic History   Marital status: Single    Spouse name: Not  on file   Number of children: Not on file   Years of education: Not on file   Highest education level: Not on file  Occupational History   Not on file  Tobacco Use   Smoking status: Every Day    Packs/day: 0.50    Pack years: 0.00    Types: Cigarettes   Smokeless tobacco: Never  Substance and Sexual Activity   Alcohol use: No   Drug use: No   Sexual activity: Not Currently    Birth control/protection: Condom, Spermicide  Other Topics Concern   Not on file  Social History Narrative   Not on file   Social Determinants of Health   Financial Resource Strain: Not on file  Food Insecurity: Not on file  Transportation Needs: Not on file  Physical Activity: Not on file  Stress: Not on file  Social Connections: Not on file    Additional Social History: She lives by herself, divorced, sees her grandchildren once in a while.  She has 2 children who live nearby.  Her mother helps her financially and she is able to make her ends meet.  She stated that she is never been able to keep a job for more than 3 months.   Allergies:   Allergies  Allergen Reactions   Erythromycin    Penicillins  Metabolic Disorder Labs: No results found for: HGBA1C, MPG No results found for: PROLACTIN Lab Results  Component Value Date   CHOL 191 10/27/2009   TRIG 124 10/27/2009   HDL 40 10/27/2009   CHOLHDL 4.8 Ratio 10/27/2009   VLDL 25 10/27/2009   LDLCALC 126 (H) 10/27/2009   LDLCALC 118 (H) 05/17/2008   Lab Results  Component Value Date   TSH 0.885 10/27/2009    Therapeutic Level Labs: No results found for: LITHIUM No results found for: CBMZ No results found for: VALPROATE  Current Medications: Current Outpatient Medications  Medication Sig Dispense Refill   calcium carbonate (TUMS - DOSED IN MG ELEMENTAL CALCIUM) 500 MG chewable tablet Chew 1 tablet by mouth daily.     diphenhydrAMINE (SOMINEX) 25 MG tablet Take 25 mg by mouth as needed.     ibuprofen (ADVIL,MOTRIN) 200 MG  tablet Take 200 mg by mouth 3 (three) times daily.     lisinopril-hydrochlorothiazide (PRINZIDE,ZESTORETIC) 20-25 MG per tablet Take 1 tablet by mouth daily.     omeprazole (PRILOSEC) 20 MG capsule Take 20 mg by mouth daily.     citalopram (CELEXA) 20 MG tablet Take 1 tablet (20 mg total) by mouth daily. 30 tablet 2   hydrOXYzine (ATARAX/VISTARIL) 25 MG tablet Take 1 tablet (25 mg total) by mouth at bedtime. 30 tablet 2   No current facility-administered medications for this visit.    Musculoskeletal: Strength & Muscle Tone: within normal limits Gait & Station: normal Patient leans: N/A  Psychiatric Specialty Exam: Review of Systems  Blood pressure (!) 168/82, pulse 74, height 5\' 2"  (1.575 m), weight 116 lb (52.6 kg).Body mass index is 21.22 kg/m.  General Appearance: Fairly Groomed and Wearing numerous accessories on her wrists as well as in her hair  Eye Contact:  Good  Speech:  Clear and Coherent and Normal Rate  Volume:  Normal  Mood:  Euthymic  Affect:  Congruent  Thought Process:  Goal Directed and Descriptions of Associations: Intact  Orientation:  Full (Time, Place, and Person)  Thought Content:  Logical  Suicidal Thoughts:  No  Homicidal Thoughts:  No  Memory:  Immediate;   Good Recent;   Good  Judgement:  Fair  Insight:  Fair  Psychomotor Activity:  Normal  Concentration:  Concentration: Good and Attention Span: Good  Recall:  Good  Fund of Knowledge:Good  Language: Good  Akathisia:  Negative  Handed:  Right  AIMS (if indicated):  0  Assets:  Communication Skills Desire for Improvement Housing Social Support Transportation Vocational/Educational  ADL's:  Intact  Cognition: WNL  Sleep:  Good   Screenings: GAD-7    Flowsheet Row Office Visit from 05/30/2020 in [facility]  Total GAD-7 Score 5      PHQ2-9    [place] Office Visit from 05/30/2020 in [facility]  PHQ-2 Total Score 0  PHQ-9  Total Score 2      [place] Office Visit from 05/30/2020 in [place] No Risk       Assessment and Plan: Patient seems to be doing well for now.   1. Major depressive disorder, recurrent episode with anxious distress (HCC)  - citalopram (CELEXA) 20 MG tablet; Take 1 tablet (20 mg total) by mouth daily.  Dispense: 30 tablet; Refill: 2 - hydrOXYzine (ATARAX/VISTARIL) 25 MG tablet; Take 1 tablet (25 mg total) by mouth at bedtime.  Dispense: 30 tablet; Refill: 2  Continue same medication regimen. Follow up in 3 months.  Patient was made aware that due to the writer leaving office her care is being transferred to another provider in the clinic.  [place] Crane, MD 6/13/20222:09 PM

## 2020-08-29 NOTE — Telephone Encounter (Signed)
Pt is scheduled to be seen by me at 1:40 today. Rx will be sent after the visit. Thanks.

## 2020-11-22 ENCOUNTER — Telehealth (HOSPITAL_COMMUNITY): Payer: Self-pay | Admitting: *Deleted

## 2020-11-22 ENCOUNTER — Other Ambulatory Visit (HOSPITAL_COMMUNITY): Payer: Self-pay | Admitting: Psychiatry

## 2020-11-22 DIAGNOSIS — F339 Major depressive disorder, recurrent, unspecified: Secondary | ICD-10-CM

## 2020-11-22 MED ORDER — HYDROXYZINE HCL 25 MG PO TABS: 25.0000 mg | ORAL_TABLET | Freq: Every evening | ORAL | 2 refills | Status: DC

## 2020-11-22 NOTE — Telephone Encounter (Signed)
Patient called seeking rx to bridge her till her first appt with Eulis Canner DNP. She has other medications, she is needing her Vistaril. She would like it called to her preferred pharmacy. She has an appt with Dr Ronne Binning on 11/29/20.

## 2020-11-29 ENCOUNTER — Other Ambulatory Visit: Payer: Self-pay

## 2020-11-29 ENCOUNTER — Encounter (HOSPITAL_COMMUNITY): Payer: Self-pay | Admitting: Psychiatry

## 2020-11-29 ENCOUNTER — Ambulatory Visit (INDEPENDENT_AMBULATORY_CARE_PROVIDER_SITE_OTHER): Payer: No Payment, Other | Admitting: Psychiatry

## 2020-11-29 DIAGNOSIS — F339 Major depressive disorder, recurrent, unspecified: Secondary | ICD-10-CM | POA: Diagnosis not present

## 2020-11-29 MED ORDER — CITALOPRAM HYDROBROMIDE 20 MG PO TABS: 20.0000 mg | ORAL_TABLET | Freq: Every day | ORAL | 3 refills | Status: DC

## 2020-11-29 MED ORDER — HYDROXYZINE HCL 25 MG PO TABS
25.0000 mg | ORAL_TABLET | Freq: Three times a day (TID) | ORAL | 3 refills | Status: DC
Start: 2020-11-29 — End: 2021-03-06
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
## 2021-03-06 ENCOUNTER — Ambulatory Visit (INDEPENDENT_AMBULATORY_CARE_PROVIDER_SITE_OTHER): Payer: No Payment, Other | Admitting: Psychiatry

## 2021-03-06 ENCOUNTER — Other Ambulatory Visit: Payer: Self-pay

## 2021-03-06 ENCOUNTER — Encounter (HOSPITAL_COMMUNITY): Payer: Self-pay | Admitting: Psychiatry

## 2021-03-06 DIAGNOSIS — F339 Major depressive disorder, recurrent, unspecified: Secondary | ICD-10-CM | POA: Diagnosis not present

## 2021-03-06 MED ORDER — HYDROXYZINE HCL 25 MG PO TABS: 25.0000 mg | ORAL_TABLET | Freq: Three times a day (TID) | ORAL | 3 refills | Status: DC

## 2021-03-06 MED ORDER — CITALOPRAM HYDROBROMIDE 20 MG PO TABS
20.0000 mg | ORAL_TABLET | Freq: Every day | ORAL | 3 refills | Status: DC
Start: 2021-03-06 — End: 2021-06-27

## 2021-03-06 NOTE — Progress Notes (Signed)
BH MD/PA/NP OP Progress Note  03/06/2021 1:43 PM Katie Montgomery  MRN:  [id number]  Chief Complaint: "The hydroxyzine has helped my anxiety" Chief Complaint   Medication Management     HPI: 60 year old female seen today for follow-up psychiatric evaluation.  She has a psychiatric history of depression, anxiety, alcohol use (in remission 16 years).  She is currently managed on Celexa 20 mg daily and hydroxyzine 25 mg nightly.  She reports her medications are effective in managing her psychiatric conditions.  Today she is pleasant, cooperative, engaged in conversation, maintained eye contact.  She informed Probation officer that the increase in hydroxyzine has improved her anxiousness.  She informed Probation officer at times she gets anxious in large especially lot shopping stores however reports that she has learned how to cope with it.  Provider conducted a GAD-7 and patient scored a 1, at her last visit she scored a 0.  Provider also conducted PHQ-9 and patient scored a 1, at her last visit she scored a 1.  She endorses adequate sleep and appetite.  Today she denies SI/HI/VAH, mania, or paranoia.    Patient informed Probation officer that she continues to maintain her sobriety from alcohol.  She notes that its been 15 years.  She informed Probation officer that she is proud of her accomplishments.  She also notes that recently she has picked up some work cleaning her mother's home and taking care of her grandchildren.  She informed Probation officer that she finds enjoyment in these activities.    No medication changes made today.  Patient agreeable to continue medication as prescribed.  No other concerns at this time.     Visit Diagnosis:    ICD-10-CM   1. Major depressive disorder, recurrent episode with anxious distress (HCC)  F33.9 citalopram (CELEXA) 20 MG tablet    hydrOXYzine (ATARAX) 25 MG tablet      Past Psychiatric History: Depression, anxiety, alcohol use disorder now in remission.  Has been sober for 15+ years.  Past Medical  History:  Past Medical History:  Diagnosis Date   Allergy    Anxiety    Depression    Hypertension     Past Surgical History:  Procedure Laterality Date   APPENDECTOMY     cyst on ovary      Family Psychiatric History: denied  Family History:  Family History  Problem Relation Age of Onset   Arthritis Mother    Diabetes Father    Diabetes Maternal Grandmother     Social History:  Social History   Socioeconomic History   Marital status: Single    Spouse name: Not on file   Number of children: Not on file   Years of education: Not on file   Highest education level: Not on file  Occupational History   Not on file  Tobacco Use   Smoking status: Every Day    Packs/day: 0.50    Types: Cigarettes   Smokeless tobacco: Never  Substance and Sexual Activity   Alcohol use: No   Drug use: No   Sexual activity: Not Currently    Birth control/protection: Condom, Spermicide  Other Topics Concern   Not on file  Social History Narrative   Not on file   Social Determinants of Health   Financial Resource Strain: Not on file  Food Insecurity: Not on file  Transportation Needs: Not on file  Physical Activity: Not on file  Stress: Not on file  Social Connections: Not on file    Allergies:  Allergies  Allergen Reactions   Erythromycin    Penicillins     Metabolic Disorder Labs: No results found for: HGBA1C, MPG No results found for: PROLACTIN Lab Results  Component Value Date   CHOL 191 10/27/2009   TRIG 124 10/27/2009   HDL 40 10/27/2009   CHOLHDL 4.8 Ratio 10/27/2009   VLDL 25 10/27/2009   LDLCALC 126 (H) 10/27/2009   LDLCALC 118 (H) 05/17/2008   Lab Results  Component Value Date   TSH 0.885 10/27/2009   TSH 1.180 05/17/2008    Therapeutic Level Labs: No results found for: LITHIUM No results found for: VALPROATE No components found for:  CBMZ  Current Medications: Current Outpatient Medications  Medication Sig Dispense Refill   calcium carbonate  (TUMS - DOSED IN MG ELEMENTAL CALCIUM) 500 MG chewable tablet Chew 1 tablet by mouth daily.     diphenhydrAMINE (SOMINEX) 25 MG tablet Take 25 mg by mouth as needed.     ibuprofen (ADVIL,MOTRIN) 200 MG tablet Take 200 mg by mouth 3 (three) times daily.     lisinopril-hydrochlorothiazide (PRINZIDE,ZESTORETIC) 20-25 MG per tablet Take 1 tablet by mouth daily.     omeprazole (PRILOSEC) 20 MG capsule Take 20 mg by mouth daily.     citalopram (CELEXA) 20 MG tablet Take 1 tablet (20 mg total) by mouth daily. 30 tablet 3   hydrOXYzine (ATARAX) 25 MG tablet Take 1 tablet (25 mg total) by mouth 3 (three) times daily. 90 tablet 3   No current facility-administered medications for this visit.     Musculoskeletal: Strength & Muscle Tone: within normal limits Gait & Station: normal Patient leans: N/A  Psychiatric Specialty Exam: Review of Systems  Blood pressure (!) 152/85, pulse 74, height 5\' 2"  (1.575 m), weight 112 lb (50.8 kg).Body mass index is 20.49 kg/m.  General Appearance: Well Groomed  Eye Contact:  Good  Speech:  Clear and Coherent and Normal Rate  Volume:  Normal  Mood:  Euthymic  Affect:  Appropriate and Congruent  Thought Process:  Coherent, Goal Directed, and Linear  Orientation:  Full (Time, Place, and Person)  Thought Content: WDL and Logical   Suicidal Thoughts:  No  Homicidal Thoughts:  No  Memory:  Immediate;   Good Recent;   Good Remote;   Good  Judgement:  Good  Insight:  Good  Psychomotor Activity:  Normal  Concentration:  Concentration: Good and Attention Span: Good  Recall:  Good  Fund of Knowledge: Good  Language: Good  Akathisia:  No  Handed:  Right  AIMS (if indicated): not done  Assets:  Communication Skills Desire for Improvement Financial Resources/Insurance Housing Leisure Time Physical Health Social Support  ADL's:  Intact  Cognition: WNL  Sleep:  Good   Screenings: GAD-7    Flowsheet Row Clinical Support from 03/06/2021 in [place] from 11/29/2020 in [facility] Office Visit from 05/30/2020 in [facility]  Total GAD-7 Score 1 0 5      PHQ2-9    Flowsheet Row Clinical Support from 03/06/2021 in [place] from 11/29/2020 in [facility] Office Visit from 05/30/2020 in [facility]  PHQ-2 Total Score 0 0 0  PHQ-9 Total Score 1 1 2       Flowsheet Row Office Visit from 05/30/2020 in [place] No Risk        Assessment  and Plan: Patient notes that she is doing well on her current medication regimen.  No medication changes made today.  Patient agreeable to continue medications as prescribed.    1. Major depressive disorder, recurrent episode with anxious distress (HCC)  Continue- citalopram (CELEXA) 20 MG tablet; Take 1 tablet (20 mg total) by mouth daily.  Dispense: 30 tablet; Refill: 3 Continue- hydrOXYzine (ATARAX/VISTARIL) 25 MG tablet; Take 1 tablet (25 mg total) by mouth 3 (three) times daily.  Dispense: 90 tablet; Refill: 3  Follow-up in 35-month Salley Slaughter, NP 03/06/2021, 1:43 PM

## 2021-05-25 ENCOUNTER — Encounter (HOSPITAL_COMMUNITY): Payer: No Payment, Other | Admitting: Psychiatry

## 2021-06-27 ENCOUNTER — Encounter (HOSPITAL_COMMUNITY): Payer: Self-pay | Admitting: Physician Assistant

## 2021-06-27 ENCOUNTER — Ambulatory Visit (INDEPENDENT_AMBULATORY_CARE_PROVIDER_SITE_OTHER): Payer: No Payment, Other | Admitting: Physician Assistant

## 2021-06-27 DIAGNOSIS — F339 Major depressive disorder, recurrent, unspecified: Secondary | ICD-10-CM | POA: Diagnosis not present

## 2021-06-27 MED ORDER — HYDROXYZINE HCL 25 MG PO TABS: 25.0000 mg | ORAL_TABLET | Freq: Three times a day (TID) | ORAL | 3 refills | Status: DC

## 2021-06-27 MED ORDER — CITALOPRAM HYDROBROMIDE 20 MG PO TABS: 20.0000 mg | ORAL_TABLET | Freq: Every day | ORAL | 3 refills | Status: DC

## 2021-06-27 NOTE — Progress Notes (Signed)
BH MD/PA/NP OP Progress Note ? ?06/27/2021 10:28 PM ?Katie Montgomery  ?MRN:  [id number] ? ?Chief Complaint:  ?Chief Complaint  ?Patient presents with  ? Medication Management  ?  F/U MM  ? ?HPI:  ? ?Katie Montgomery is a 61 year old female with a past psychiatric history significant for major depressive disorder with anxious distress who presents to [facility] for follow-up and medication management.  Patient is currently being managed on the following medications: ? ?Citalopram 20 mg daily ?Hydroxyzine 25 mg 3 times daily as needed ? ?Patient reports that she has been doing well.  She reports experiencing a small panic attack prior to her encounter due to not wanting to be late.  She notes that her blood pressure was elevated prior to presenting to the provider.  Patient further added that her blood pressure is often affected by her elevated anxiety.  Patient presents today requesting refills on her medications and states that her hydroxyzine has been helpful in managing her anxiety.  Patient reports that she takes half a tablet of hydroxyzine in the morning, half a tablet during lunchtime, and a full tablet prior to going to bed.  She reports that taking her hydroxyzine in this manner has proven very effective in managing her anxiety. ? ?Patient reports that she has been experiencing a ringing in her ears that has been going on for a month and a half.  She reports having a past history of head injury but denies any issues with her memory.  Patient is unsure if the ringing has anything to do with her psychiatric medications.  She states that if she is unable to take her medications, she finds it difficult to concentrate.  She describes the feeling as walking through fog along with the walls closing in on her.  Patient denies depressive symptoms stating that her mood has improved in the presence of the springtime weather.  Patient endorses mild anxiety and rates her anxiety a  3 out of 10.  Patient denies any new stressors at this time. ? ?And is alert and oriented x4, pleasant, calm, cooperative, and fully engaged in conversation during the encounter.  Patient endorses feeling tired.  Patient denies suicidal or homicidal ideations.  She further denies auditory or visual hallucinations and does not appear to be responding to internal/external stimuli.  Patient endorses fair sleep and receives on average 7 hours of sleep each night.  She reports that the ringing in her ears disrupts her sleep.  Patient endorses good appetite and eats on average 2 meals a day along with a snack.  Patient denies alcohol consumption stating that she is 16/17 years sober.  Patient endorses tobacco use and smokes on average a pack per day.  Patient denies illicit drug use. ? ?Visit Diagnosis:  ?  ICD-10-CM   ?1. Major depressive disorder, recurrent episode with anxious distress (HCC)  F33.9 hydrOXYzine (ATARAX) 25 MG tablet  ?  citalopram (CELEXA) 20 MG tablet  ?  ? ? ?Past Psychiatric History:  ?Depression ?Anxiety ?Alcohol use disorder now in remission. Has been sober for 15+ years. ? ?Past Medical History:  ?Past Medical History:  ?Diagnosis Date  ? Allergy   ? Anxiety   ? Depression   ? Hypertension   ?  ?Past Surgical History:  ?Procedure Laterality Date  ? APPENDECTOMY    ? cyst on ovary    ? ? ?Family Psychiatric History:  ?Denied ? ?Family History:  ?Family History  ?Problem  Relation Age of Onset  ? Arthritis Mother   ? Diabetes Father   ? Diabetes Maternal Grandmother   ? ? ?Social History:  ?Social History  ? ?Socioeconomic History  ? Marital status: Single  ?  Spouse name: Not on file  ? Number of children: Not on file  ? Years of education: Not on file  ? Highest education level: Not on file  ?Occupational History  ? Not on file  ?Tobacco Use  ? Smoking status: Every Day  ?  Packs/day: 0.50  ?  Types: Cigarettes  ? Smokeless tobacco: Never  ?Substance and Sexual Activity  ? Alcohol use: No  ? Drug  use: No  ? Sexual activity: Not Currently  ?  Birth control/protection: Condom, Spermicide  ?Other Topics Concern  ? Not on file  ?Social History Narrative  ? Not on file  ? ?Social Determinants of Health  ? ?Financial Resource Strain: Not on file  ?Food Insecurity: Not on file  ?Transportation Needs: Not on file  ?Physical Activity: Not on file  ?Stress: Not on file  ?Social Connections: Not on file  ? ? ?Allergies:  ?Allergies  ?Allergen Reactions  ? Erythromycin   ? Penicillins   ? ? ?Metabolic Disorder Labs: ?No results found for: HGBA1C, MPG ?No results found for: PROLACTIN ?Lab Results  ?Component Value Date  ? CHOL 191 10/27/2009  ? TRIG 124 10/27/2009  ? HDL 40 10/27/2009  ? CHOLHDL 4.8 Ratio 10/27/2009  ? VLDL 25 10/27/2009  ? LDLCALC 126 (H) 10/27/2009  ? LDLCALC 118 (H) 05/17/2008  ? ?Lab Results  ?Component Value Date  ? TSH 0.885 10/27/2009  ? TSH 1.180 05/17/2008  ? ? ?Therapeutic Level Labs: ?No results found for: LITHIUM ?No results found for: VALPROATE ?No components found for:  CBMZ ? ?Current Medications: ?Current Outpatient Medications  ?Medication Sig Dispense Refill  ? calcium carbonate (TUMS - DOSED IN MG ELEMENTAL CALCIUM) 500 MG chewable tablet Chew 1 tablet by mouth daily.    ? diphenhydrAMINE (SOMINEX) 25 MG tablet Take 25 mg by mouth as needed.    ? ibuprofen (ADVIL,MOTRIN) 200 MG tablet Take 200 mg by mouth 3 (three) times daily.    ? lisinopril-hydrochlorothiazide (PRINZIDE,ZESTORETIC) 20-25 MG per tablet Take 1 tablet by mouth daily.    ? omeprazole (PRILOSEC) 20 MG capsule Take 20 mg by mouth daily.    ? citalopram (CELEXA) 20 MG tablet Take 1 tablet (20 mg total) by mouth daily. 30 tablet 3  ? hydrOXYzine (ATARAX) 25 MG tablet Take 1 tablet (25 mg total) by mouth 3 (three) times daily. 90 tablet 3  ? ?No current facility-administered medications for this visit.  ? ? ? ?Musculoskeletal: ?Strength & Muscle Tone: within normal limits ?Gait & Station: normal ?Patient leans:  N/A ? ?Psychiatric Specialty Exam: ?Review of Systems  ?Psychiatric/Behavioral:  Positive for sleep disturbance. Negative for decreased concentration, dysphoric mood, hallucinations, self-injury and suicidal ideas. The patient is not nervous/anxious and is not hyperactive.    ?Blood pressure (!) 151/91, pulse 86, height '5\' 2"'$  (1.575 m), weight 113 lb (51.3 kg).Body mass index is 20.67 kg/m?.  ?General Appearance: Well Groomed  ?Eye Contact:  Good  ?Speech:  Clear and Coherent and Normal Rate  ?Volume:  Normal  ?Mood:  Euthymic  ?Affect:  Appropriate  ?Thought Process:  Coherent and Descriptions of Associations: Intact  ?Orientation:  Full (Time, Place, and Person)  ?Thought Content: WDL   ?Suicidal Thoughts:  No  ?Homicidal Thoughts:  No  ?  Memory:  Immediate;   Good ?Recent;   Good ?Remote;   Good  ?Judgement:  Good  ?Insight:  Good  ?Psychomotor Activity:  Normal  ?Concentration:  Concentration: Good and Attention Span: Good  ?Recall:  Good  ?Fund of Knowledge: Good  ?Language: Good  ?Akathisia:  No  ?Handed:  Right  ?AIMS (if indicated): not done  ?Assets:  Communication Skills ?Desire for Improvement ?Financial Resources/Insurance ?Housing ?Leisure Time ?Physical Health ?Social Support  ?ADL's:  Intact  ?Cognition: WNL  ?Sleep:  Good  ? ?Screenings: ?GAD-7   ? ?[place] Office Visit from 06/27/2021 in [place] from 03/06/2021 in [facility] Clinical Support from 11/29/2020 in [facility] Office Visit from 05/30/2020 in [facility]  ?Total GAD-7 Score 2 1 0 5  ? ?  ? ?PHQ2-9   ? ?[place] Office Visit from 06/27/2021 in [place] from 03/06/2021 in [facility] Clinical Support from 11/29/2020 in [facility] Office Visit from 05/30/2020 in [facility]  ?PHQ-2 Total Score 0 0 0 0  ?PHQ-9 Total Score -- '1 1 2  '$ ? ?  ? ?[place] Office Visit from 06/27/2021 in [facility] Office Visit from 05/30/2020 in [facility]

## 2021-09-26 ENCOUNTER — Ambulatory Visit (INDEPENDENT_AMBULATORY_CARE_PROVIDER_SITE_OTHER): Payer: No Payment, Other | Admitting: Psychiatry

## 2021-09-26 VITALS — BP 162/81 | HR 97

## 2021-09-26 DIAGNOSIS — F3341 Major depressive disorder, recurrent, in partial remission: Secondary | ICD-10-CM

## 2021-09-26 DIAGNOSIS — F339 Major depressive disorder, recurrent, unspecified: Secondary | ICD-10-CM

## 2021-09-26 MED ORDER — HYDROXYZINE HCL 25 MG PO TABS: 12.5000 mg | ORAL_TABLET | Freq: Three times a day (TID) | ORAL | 2 refills | Status: DC

## 2021-09-26 MED ORDER — CITALOPRAM HYDROBROMIDE 20 MG PO TABS: 20.0000 mg | ORAL_TABLET | Freq: Every day | ORAL | 2 refills | Status: DC

## 2021-09-26 NOTE — Progress Notes (Signed)
BH MD/PA/NP OP Progress Note  09/26/2021 4:56 PM Katie REIERSON  MRN:  [id number]  Chief Complaint:  Medication refills HPI:   Katie Montgomery. Stlouis is a 61 year old female with a past psychiatric history significant for major depressive disorder with anxious distress who presents to [facility] for follow-up and medication management.    Patient reports that she has been doing well.  She states that she needs medication refills.  Pt reports that her mood is " ok". She denies any depression but still has some anxiety with occasional panic attacks.  She reports that she was having anxiety as she did not want to get late for her appointment.  She reports that she does not sleep well sometimes and wake up in the middle of the night.  She reports eating well and currently trying to lose weight and eats salads mostly.  Currently, she denies any suicidal ideations, homicidal ideations, auditory and visual hallucinations.  She denies paranoia.  She denies any medication side effects and has been tolerating it well. She reports no change in her current stressors.  She is currently unemployed and want to file for disability due to her knee injury.  She lives by herself. She does not drinks alcohol  and denies using any illicit drugs.  She smokes 1 pack/day cigarettes.  She reports that she has tried everything to quit it but nothing worked for her. Patient denies any need for change in medication and medication dosages and wants to continue same meds.  She reports some ringing in her ears.  She got it checked by a doctor who cleaned her ear but still she is reporting ringing.  She reports that she had head trauma in the past but is not sure what is causing this ringing.  Discussed that it is unlikely due to current psych medications.  Discussed following up with PCP for hypertension and ringing.  She agrees with the plan.  She denies any other concerns. Patient is alert and  oriented x 4, anxious, cooperative, and fully engaged in conversation during the encounter.  Her thought process is tangential with coherent speech . She does not appear to be responding to internal/external stimuli .  PHQ-2-0, and GAD-7-3. Discussed starting medication for sleep but patient refused.  Visit Diagnosis:    ICD-10-CM   1. Major depressive disorder, recurrent episode, in partial remission with anxious distress (HCC)  F33.41     2. Major depressive disorder, recurrent episode with anxious distress (HCC)  F33.9 citalopram (CELEXA) 20 MG tablet    hydrOXYzine (ATARAX) 25 MG tablet      Past Psychiatric History:  Depression Anxiety Alcohol use disorder now in remission. Has been sober for 15+ years.  Past Medical History:  Past Medical History:  Diagnosis Date   Allergy    Anxiety    Depression    Hypertension     Past Surgical History:  Procedure Laterality Date   APPENDECTOMY     cyst on ovary      Family Psychiatric History:  Denied  Family History:  Family History  Problem Relation Age of Onset   Arthritis Mother    Diabetes Father    Diabetes Maternal Grandmother     Social History:  Social History   Socioeconomic History   Marital status: Single    Spouse name: Not on file   Number of children: Not on file   Years of education: Not on file   Highest education level:  Not on file  Occupational History   Not on file  Tobacco Use   Smoking status: Every Day    Packs/day: 0.50    Types: Cigarettes   Smokeless tobacco: Never  Substance and Sexual Activity   Alcohol use: No   Drug use: No   Sexual activity: Not Currently    Birth control/protection: Condom, Spermicide  Other Topics Concern   Not on file  Social History Narrative   Not on file   Social Determinants of Health   Financial Resource Strain: Not on file  Food Insecurity: Not on file  Transportation Needs: Not on file  Physical Activity: Not on file  Stress: Not on file   Social Connections: Not on file    Allergies:  Allergies  Allergen Reactions   Erythromycin    Penicillins     Metabolic Disorder Labs: No results found for: "HGBA1C", "MPG" No results found for: "PROLACTIN" Lab Results  Component Value Date   CHOL 191 10/27/2009   TRIG 124 10/27/2009   HDL 40 10/27/2009   CHOLHDL 4.8 Ratio 10/27/2009   VLDL 25 10/27/2009   LDLCALC 126 (H) 10/27/2009   LDLCALC 118 (H) 05/17/2008   Lab Results  Component Value Date   TSH 0.885 10/27/2009   TSH 1.180 05/17/2008    Therapeutic Level Labs: No results found for: "LITHIUM" No results found for: "VALPROATE" No results found for: "CBMZ"  Current Medications: Current Outpatient Medications  Medication Sig Dispense Refill   calcium carbonate (TUMS - DOSED IN MG ELEMENTAL CALCIUM) 500 MG chewable tablet Chew 1 tablet by mouth daily.     citalopram (CELEXA) 20 MG tablet Take 1 tablet (20 mg total) by mouth daily. 30 tablet 2   diphenhydrAMINE (SOMINEX) 25 MG tablet Take 25 mg by mouth as needed.     hydrOXYzine (ATARAX) 25 MG tablet Take 0.5 tablets (12.5 mg total) by mouth 3 (three) times daily. 45 tablet 2   ibuprofen (ADVIL,MOTRIN) 200 MG tablet Take 200 mg by mouth 3 (three) times daily.     lisinopril-hydrochlorothiazide (PRINZIDE,ZESTORETIC) 20-25 MG per tablet Take 1 tablet by mouth daily.     omeprazole (PRILOSEC) 20 MG capsule Take 20 mg by mouth daily.     No current facility-administered medications for this visit.     Musculoskeletal: Strength & Muscle Tone: within normal limits Gait & Station: normal Patient leans: N/A  Psychiatric Specialty Exam: Review of Systems  HENT:  Positive for tinnitus.   Psychiatric/Behavioral:  Positive for sleep disturbance. Negative for decreased concentration, dysphoric mood, hallucinations, self-injury and suicidal ideas. The patient is not nervous/anxious and is not hyperactive.     Blood pressure (!) 162/81, pulse 97, SpO2 93 %.There is  no height or weight on file to calculate BMI.  General Appearance: Well Groomed  Eye Contact:  Good  Speech:  Clear and Coherent and Normal Rate  Volume:  Normal  Mood:  Euthymic  Affect:  Appropriate and Full Range  Thought Process:  Coherent and Descriptions of Associations: Tangential  Orientation:  Full (Time, Place, and Person)  Thought Content: WDL   Suicidal Thoughts:  No  Homicidal Thoughts:  No  Memory:  Immediate;   Good Recent;   Good Remote;   Good  Judgement:  Good  Insight:  Good  Psychomotor Activity:  Normal  Concentration:  Concentration: Good and Attention Span: Good  Recall:  Good  Fund of Knowledge: Good  Language: Good  Akathisia:  No  Handed:  Right  AIMS (if indicated): not done  Assets:  Communication Skills Desire for Improvement Financial Resources/Insurance Housing Leisure Time Physical Health Social Support  ADL's:  Intact  Cognition: WNL  Sleep:  Fair   Screenings: GAD-7    Flowsheet Row Clinical Support from 09/26/2021 in [facility] Office Visit from 06/27/2021 in [place] from 03/06/2021 in [facility] Clinical Support from 11/29/2020 in [facility] Office Visit from 05/30/2020 in [facility]  Total GAD-7 Score '3 2 1 '$ 0 5      PHQ2-9    Flowsheet Row Clinical Support from 09/26/2021 in [facility] Office Visit from 06/27/2021 in [place] from 03/06/2021 in [place] from 11/29/2020 in [facility] Office Visit from 05/30/2020 in [facility]  PHQ-2 Total Score 0 0 0 0 0  PHQ-9 Total Score -- -- '1 1 2      '$ Flowsheet Row Office Visit from 06/27/2021 in [facility] Office Visit  from 05/30/2020 in [place] No Risk        Assessment and Plan:   Kiley Solimine. Matherly is a 61 year old female with a past psychiatric history significant for major depressive disorder with anxious distress who presents to [facility] for follow-up and medication management.  Patient presents today requesting refills on her medications.  She reports that her medications are helpful in managing her depression and anxiety.  Patient endorses some anxiety but denies depressive episodes.  Patient endorses ringing in her ears that does not appear to be attributed to her medication use.  Patient wants to continue taking her medications as prescribed.  Discussed starting medication for sleep but patient refused. Patient's medications to be e-prescribed to pharmacy of choice.  Collaboration of Care: Collaboration of Care: Medication Management AEB provider managing patient's psychiatric medications, Primary Care Provider AEB patient being followed by a primary care provider, and Psychiatrist AEB patient being followed by a mental health provider  Patient/Guardian was advised Release of Information must be obtained prior to any record release in order to collaborate their care with an outside provider. Patient/Guardian was advised if they have not already done so to contact the registration department to sign all necessary forms in order for [place] to release information regarding their care.   Consent: Patient/Guardian gives verbal consent for treatment and assignment of benefits for services provided during this visit. Patient/Guardian expressed understanding and agreed to proceed.   1. Major depressive disorder, recurrent episode with anxious distress (HCC)  - hydrOXYzine (ATARAX) 25 MG tablet; Take 1/2 tablet (12.5 mg total) by mouth 3 (three) times daily.  Dispense: 45 tablets; Refill: 2 - citalopram  (CELEXA) 20 MG tablet; Take 1 tablet (20 mg total) by mouth daily.  Dispense: 30 tablet; Refill: 2  Patient to follow up in 2 months Provider spent a total of 25 minutes with the patient/reviewing patient's chart.  Hypertension Tinnitus Recommend follow-up with PCP  Armando Reichert, MD PGY 3 09/26/2021, 4:56 PM

## 2021-09-26 NOTE — Patient Instructions (Signed)
Follow up in 2 months

## 2021-12-01 ENCOUNTER — Encounter (HOSPITAL_COMMUNITY): Payer: No Payment, Other | Admitting: Psychiatry

## 2021-12-04 ENCOUNTER — Ambulatory Visit (INDEPENDENT_AMBULATORY_CARE_PROVIDER_SITE_OTHER): Payer: No Payment, Other | Admitting: Psychiatry

## 2021-12-04 VITALS — BP 143/79 | HR 93

## 2021-12-04 DIAGNOSIS — F3340 Major depressive disorder, recurrent, in remission, unspecified: Secondary | ICD-10-CM

## 2021-12-04 MED ORDER — CITALOPRAM HYDROBROMIDE 20 MG PO TABS: 20.0000 mg | ORAL_TABLET | Freq: Every day | ORAL | 2 refills | Status: DC

## 2021-12-04 MED ORDER — HYDROXYZINE HCL 25 MG PO TABS: 12.5000 mg | ORAL_TABLET | Freq: Three times a day (TID) | ORAL | 2 refills | Status: DC

## 2021-12-04 NOTE — Progress Notes (Signed)
Katie Montgomery/PA/NP OP Progress Note  12/04/2021 2:54 PM Katie Montgomery  MRN:  [id number]  Chief Complaint:  Medication refills HPI:   Katie Montgomery. Katie Montgomery is a 61 year old female with a past psychiatric history significant for major depressive disorder with anxious distress who presents to [facility] for follow-up and medication management.    Patient reports that she has been doing well.  She states that she needs medication refills.  Pt reports that her mood is " fine". She denies any depression and her anxiety has been controlled with hydroxyzine.  She reports that her grand son had birthday party  last weekend  and she felt good.  She has been sleeping and eating well.  Currently, she denies any suicidal ideations, homicidal ideations, auditory and visual hallucinations.  She denies paranoia.  She denies any medication side effects and has been tolerating it well.  She reports no change in her current stressors. She is still working on her disability paperwork due to her knee injury and her mom has been helping her financially.  She lives by herself. She denies use of alcohol or drugs.  She does smokes 1 pack/day cigarettes and has been trying to cut down.  She reports that she has tried nicotine patch, gum  and other methods in the past which did not work.  She reports that recently she had blood work done at her doctor's office and everything was fine.  I was not able to see blood work in the chart and asked her to bring blood work report next time.  Patient denies any need for change in medication and medication dosages and wants to continue same meds. She reports that her ringing has improved after she got her ears cleaned by her doctor.   Patient is alert and oriented x 4, anxious, cooperative, and fully engaged in conversation during the encounter.  Her thought process is linear with coherent speech . She does not appear to be responding to internal/external  stimuli .   Visit Diagnosis:    ICD-10-CM   1. 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  Past Medical History:  Past Medical History:  Diagnosis Date   Allergy    Anxiety    Depression    Hypertension     Past Surgical History:  Procedure Laterality Date   APPENDECTOMY     cyst on ovary      Family Psychiatric History:  Denied  Family History:  Family History  Problem Relation Age of Onset   Arthritis Mother    Diabetes Father    Diabetes Maternal Grandmother     Social History:  Social History   Socioeconomic History   Marital status: Single    Spouse name: Not on file   Number of children: Not on file   Years of education: Not on file   Highest education level: Not on file  Occupational History   Not on file  Tobacco Use   Smoking status: Every Day    Packs/day: 0.50    Types: Cigarettes   Smokeless tobacco: Never  Substance and Sexual Activity   Alcohol use: No   Drug use: No   Sexual activity: Not Currently    Birth control/protection: Condom, Spermicide  Other Topics Concern   Not on file  Social History Narrative   Not on file   Social Determinants of Health   Financial Resource Strain: Not on file  Food Insecurity: Not on file  Transportation Needs: Not on file  Physical Activity: Not on file  Stress: Not on file  Social Connections: Not on file    Allergies:  Allergies  Allergen Reactions   Erythromycin    Penicillins     Metabolic Disorder Labs: No results found for: "HGBA1C", "MPG" No results found for: "PROLACTIN" Lab Results  Component Value Date   CHOL 191 10/27/2009   TRIG 124 10/27/2009   HDL 40 10/27/2009   CHOLHDL 4.8 Ratio 10/27/2009   VLDL 25 10/27/2009   LDLCALC 126 (H) 10/27/2009    LDLCALC 118 (H) 05/17/2008   Lab Results  Component Value Date   TSH 0.885 10/27/2009   TSH 1.180 05/17/2008    Therapeutic Level Labs: No results found for: "LITHIUM" No results found for: "VALPROATE" No results found for: "CBMZ"  Current Medications: Current Outpatient Medications  Medication Sig Dispense Refill   calcium carbonate (TUMS - DOSED IN MG ELEMENTAL CALCIUM) 500 MG chewable tablet Chew 1 tablet by mouth daily.     citalopram (CELEXA) 20 MG tablet Take 1 tablet (20 mg total) by mouth daily. 30 tablet 2   diphenhydrAMINE (SOMINEX) 25 MG tablet Take 25 mg by mouth as needed.     hydrOXYzine (ATARAX) 25 MG tablet Take 0.5 tablets (12.5 mg total) by mouth 3 (three) times daily. 45 tablet 2   ibuprofen (ADVIL,MOTRIN) 200 MG tablet Take 200 mg by mouth 3 (three) times daily.     lisinopril-hydrochlorothiazide (PRINZIDE,ZESTORETIC) 20-25 MG per tablet Take 1 tablet by mouth daily.     omeprazole (PRILOSEC) 20 MG capsule Take 20 mg by mouth daily.     No current facility-administered medications for this visit.     Musculoskeletal: Strength & Muscle Tone: within normal limits Gait & Station: normal Patient leans: N/A  Psychiatric Specialty Exam: Review of Systems  HENT:  Positive for tinnitus.   Psychiatric/Behavioral:  Positive for sleep disturbance. Negative for decreased concentration, dysphoric mood, hallucinations, self-injury and suicidal ideas. The patient is not nervous/anxious and is not hyperactive.     Blood pressure (!) 143/79, pulse 93, SpO2 99 %.There is no height or weight on file to calculate BMI.  General Appearance: Well Groomed  Eye Contact:  Good  Speech:  Clear and Coherent and Normal Rate  Volume:  Normal  Mood:  Euthymic  Affect:  Appropriate and Full Range  Thought Process:  Coherent and Descriptions of Associations: Tangential  Orientation:  Full (Time, Place, and Person)  Thought Content: WDL   Suicidal Thoughts:  No  Homicidal Thoughts:   No  Memory:  Immediate;   Good Recent;   Good Remote;   Good  Judgement:  Good  Insight:  Good  Psychomotor Activity:  Normal  Concentration:  Concentration: Good and Attention Span: Good  Recall:  Good  Fund of Knowledge: Good  Language: Good  Akathisia:  No  Handed:  Right  AIMS (if indicated): not done  Assets:  Communication Skills Desire for Improvement Financial Resources/Insurance Housing Leisure Time Physical Health Social Support  ADL's:  Intact  Cognition: WNL  Sleep:  Fair   Screenings: GAD-7    Flowsheet Row Clinical Support from 09/26/2021 in [facility] Office Visit from 06/27/2021 in [place]  from 03/06/2021 in [facility] Clinical Support from 11/29/2020 in [facility] Office Visit from 05/30/2020 in [facility]  Total GAD-7 Score '3 2 1 '$ 0 5      PHQ2-9    Flowsheet Row Clinical Support from 09/26/2021 in [facility] Office Visit from 06/27/2021 in [place] from 03/06/2021 in [place] from 11/29/2020 in [facility] Office Visit from 05/30/2020 in [facility]  PHQ-2 Total Score 0 0 0 0 0  PHQ-9 Total Score -- -- '1 1 2      '$ Flowsheet Row Office Visit from 06/27/2021 in [facility] Office Visit from 05/30/2020 in [place] No Risk        Assessment and Plan:   Trenia Tennyson. Scarbro is a 60 year old female with a past psychiatric history significant for major depressive disorder with anxious distress who presents to [facility] for follow-up and medication management.  Patient presents today  requesting refills on her medications.  She reports that her medications are helpful in managing her depression and anxiety.  Her depression and anxiety has been stable with current medications.  Patient's medications to be e-prescribed to pharmacy of choice.  Collaboration of Care: Collaboration of Care: Medication Management AEB provider managing patient's psychiatric medications, Primary Care Provider AEB patient being followed by a primary care provider, and Psychiatrist AEB patient being followed by a mental health provider  Patient/Guardian was advised Release of Information must be obtained prior to any record release in order to collaborate their care with an outside provider. Patient/Guardian was advised if they have not already done so to contact the registration department to sign all necessary forms in order for [place] to release information regarding their care.   Consent: Patient/Guardian gives verbal consent for treatment and assignment of benefits for services provided during this visit. Patient/Guardian expressed understanding and agreed to proceed.   1. Major depressive disorder, recurrent episode with anxious distress (HCC)  - hydrOXYzine (ATARAX) 25 MG tablet; Take 1/2 tablet (12.5 mg total) by mouth 3 (three) times daily.  Dispense: 45 tablets; Refill: 2 - citalopram (CELEXA) 20 MG tablet; Take 1 tablet (20 mg total) by mouth daily.  Dispense: 30 tablet; Refill: 2  Patient to follow up in 2 months Provider spent a total of 20 minutes with the patient/reviewing patient's chart.   Armando Reichert, [place] PGY 3 12/04/2021, 2:54 PM

## 2021-12-04 NOTE — Patient Instructions (Signed)
Follow up in 8 weeks 

## 2022-01-30 ENCOUNTER — Ambulatory Visit (INDEPENDENT_AMBULATORY_CARE_PROVIDER_SITE_OTHER): Payer: No Payment, Other | Admitting: Psychiatry

## 2022-01-30 VITALS — BP 140/77 | HR 84

## 2022-01-30 DIAGNOSIS — F17210 Nicotine dependence, cigarettes, uncomplicated: Secondary | ICD-10-CM | POA: Diagnosis not present

## 2022-01-30 DIAGNOSIS — F3340 Major depressive disorder, recurrent, in remission, unspecified: Secondary | ICD-10-CM

## 2022-01-30 MED ORDER — CITALOPRAM HYDROBROMIDE 20 MG PO TABS: 20.0000 mg | ORAL_TABLET | Freq: Every day | ORAL | 2 refills | Status: DC

## 2022-01-30 MED ORDER — NICOTINE POLACRILEX 2 MG MT GUM: 2.0000 mg | CHEWING_GUM | OROMUCOSAL | 2 refills | Status: DC | PRN

## 2022-01-30 MED ORDER — HYDROXYZINE HCL 25 MG PO TABS: 12.5000 mg | ORAL_TABLET | Freq: Three times a day (TID) | ORAL | 2 refills | Status: DC

## 2022-01-30 NOTE — Progress Notes (Signed)
BH MD/PA/NP OP Progress Note  01/30/2022 1:31 PM Katie Montgomery  MRN:  [id number]  Chief Complaint:  Medication refills HPI:   Katie Montgomery. Katie Montgomery is a 61 year old female with a past psychiatric history significant for major depressive disorder with anxious distress who presents to [facility] for follow-up and medication management.    Patient reports that she has been doing well.  She reports that her mood has been stable and denies any severe panic episodes. Currently, she denies SI, HI, AVH.  She reports that she has to wake multiple times at night due to bladder issues.  She reports that she takes a half of hydroxyzine more to help with sleep but then she feels groggy.  She reports that if she takes hydroxyzine at 6 PM then she does not feel groggy the next day.  She reports stable appetite.  She denies any changes in her stressors. She lives by herself. She denies use of alcohol or drugs.  She does smokes 1 pack/day cigarettes and has been trying to cut down.  She reports that she tried nicotine patch in the past which did not work.  Discussed starting nicotine gum.  She agrees with the plan.   Patient denies any need for change in medication and medication dosages and wants to continue same meds.   Patient is alert and oriented x 4, anxious, cooperative, and fully engaged in conversation during the encounter.  Her thought process is linear with coherent speech . She does not appear to be responding to internal/external stimuli .   Visit Diagnosis:    ICD-10-CM   1. MDD (recurrent major depressive disorder) in remission (HCC)  F33.40 hydrOXYzine (ATARAX) 25 MG tablet    citalopram (CELEXA) 20 MG tablet    2. 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  Past Medical  History:  Past Medical History:  Diagnosis Date   Allergy    Anxiety    Depression    Hypertension     Past Surgical History:  Procedure Laterality Date   APPENDECTOMY     cyst on ovary      Family Psychiatric History:  Denied  Family History:  Family History  Problem Relation Age of Onset   Arthritis Mother    Diabetes Father    Diabetes Maternal Grandmother     Social History:  Social History   Socioeconomic History   Marital status: Single    Spouse name: Not on file   Number of children: Not on file   Years of education: Not on file   Highest education level: Not on file  Occupational History   Not on file  Tobacco Use   Smoking status: Every Day    Packs/day: 0.50    Types: Cigarettes   Smokeless tobacco: Never  Substance and Sexual Activity   Alcohol use: No   Drug use: No   Sexual activity: Not Currently    Birth control/protection: Condom, Spermicide  Other Topics Concern   Not on file  Social History Narrative   Not on file   Social Determinants of Health   Financial Resource Strain: Not on file  Food Insecurity: Not on file  Transportation Needs: Not on file  Physical Activity: Not on file  Stress: Not on file  Social Connections: Not on  file    Allergies:  Allergies  Allergen Reactions   Erythromycin    Penicillins     Metabolic Disorder Labs: No results found for: "HGBA1C", "MPG" No results found for: "PROLACTIN" Lab Results  Component Value Date   CHOL 191 10/27/2009   TRIG 124 10/27/2009   HDL 40 10/27/2009   CHOLHDL 4.8 Ratio 10/27/2009   VLDL 25 10/27/2009   LDLCALC 126 (H) 10/27/2009   LDLCALC 118 (H) 05/17/2008   Lab Results  Component Value Date   TSH 0.885 10/27/2009   TSH 1.180 05/17/2008    Therapeutic Level Labs: No results found for: "LITHIUM" No results found for: "VALPROATE" No results found for: "CBMZ"  Current Medications: Current Outpatient Medications  Medication Sig Dispense Refill   nicotine  polacrilex (NICORETTE) 2 MG gum Take 1 each (2 mg total) by mouth as needed for smoking cessation. 100 tablet 2   calcium carbonate (TUMS - DOSED IN MG ELEMENTAL CALCIUM) 500 MG chewable tablet Chew 1 tablet by mouth daily.     citalopram (CELEXA) 20 MG tablet Take 1 tablet (20 mg total) by mouth daily. 30 tablet 2   diphenhydrAMINE (SOMINEX) 25 MG tablet Take 25 mg by mouth as needed.     hydrOXYzine (ATARAX) 25 MG tablet Take 0.5 tablets (12.5 mg total) by mouth 3 (three) times daily. 45 tablet 2   ibuprofen (ADVIL,MOTRIN) 200 MG tablet Take 200 mg by mouth 3 (three) times daily.     lisinopril-hydrochlorothiazide (PRINZIDE,ZESTORETIC) 20-25 MG per tablet Take 1 tablet by mouth daily.     omeprazole (PRILOSEC) 20 MG capsule Take 20 mg by mouth daily.     No current facility-administered medications for this visit.     Musculoskeletal: Strength & Muscle Tone: within normal limits Gait & Station: normal Patient leans: N/A  Psychiatric Specialty Exam: Review of Systems  Psychiatric/Behavioral:  Positive for sleep disturbance. Negative for decreased concentration, dysphoric mood, hallucinations, self-injury and suicidal ideas. The patient is not nervous/anxious and is not hyperactive.     Blood pressure (!) 140/77, pulse 84, SpO2 99 %.There is no height or weight on file to calculate BMI.  General Appearance: Well Groomed  Eye Contact:  Good  Speech:  Clear and Coherent and Normal Rate  Volume:  Normal  Mood:  Euthymic  Affect:  Appropriate and Full Range  Thought Process:  Coherent and Descriptions of Associations: Tangential  Orientation:  Full (Time, Place, and Person)  Thought Content: WDL   Suicidal Thoughts:  No  Homicidal Thoughts:  No  Memory:  Immediate;   Good Recent;   Good Remote;   Good  Judgement:  Good  Insight:  Good  Psychomotor Activity:  Normal  Concentration:  Concentration: Good and Attention Span: Good  Recall:  Good  Fund of Knowledge: Good  Language:  Good  Akathisia:  No  Handed:  Right  AIMS (if indicated): not done  Assets:  Communication Skills Desire for Improvement Financial Resources/Insurance Housing Leisure Time Physical Health Social Support  ADL's:  Intact  Cognition: WNL  Sleep:  Fair   Screenings: GAD-7    Flowsheet Row Clinical Support from 09/26/2021 in [facility] Office Visit from 06/27/2021 in [place] from 03/06/2021 in [place] from 11/29/2020 in [facility] Office Visit from 05/30/2020 in [facility]  Total GAD-7 Score '3 2 1 '$ 0 5  PHQ2-9    Flowsheet Row Clinical Support from 09/26/2021 in [facility] Office Visit from 06/27/2021 in [place] from 03/06/2021 in [facility] Clinical Support from 11/29/2020 in [facility] Office Visit from 05/30/2020 in [facility]  PHQ-2 Total Score 0 0 0 0 0  PHQ-9 Total Score -- -- '1 1 2      '$ Flowsheet Row Office Visit from 06/27/2021 in [facility] Office Visit from 05/30/2020 in [place] No Risk        Assessment and Plan:   Dereka Lueras. [place] is a 61 year old female with a past psychiatric history significant for major depressive disorder with anxious distress who presents to [facility] for follow-up and medication management.    She reports that her medications are helpful in managing her depression and anxiety. Her depression and anxiety has been stable with current medications.  Patient's medications to be e-prescribed to pharmacy of choice.  She wants to stop smoking and wants to try nicotine gum  to help with smoking cessation  Collaboration of Care: Collaboration of Care: Dr Sande Rives  Patient/Guardian was advised Release of Information must be obtained prior to any record release in order to collaborate their care with an outside provider. Patient/Guardian was advised if they have not already done so to contact the registration department to sign all necessary forms in order for [place] to release information regarding their care.   Consent: Patient/Guardian gives verbal consent for treatment and assignment of benefits for services provided during this visit. Patient/Guardian expressed understanding and agreed to proceed.   1. Major depressive disorder, in remission (HCC)  - hydrOXYzine (ATARAX) 25 MG tablet; Take 1/2 tablet (12.5 mg total) by mouth 3 (three) times daily.  Dispense: 45 tablets; Refill: 2 - citalopram (CELEXA) 20 MG tablet; Take 1 tablet (20 mg total) by mouth daily.  Dispense: 30 tablet; Refill: 2  2. Nicotine dependence -Start nicotine gum 2 g as needed.  30-day prescription with 2 refills sent to patient's pharmacy.  Patient to follow up in 2 months.  Provider spent a total of 20 minutes with the patient/reviewing patient's chart.   Armando Reichert, MD PGY 3 01/30/2022, 1:31 PM

## 2022-01-30 NOTE — Patient Instructions (Signed)
Follow up in 2 months

## 2022-04-02 ENCOUNTER — Ambulatory Visit (HOSPITAL_COMMUNITY): Payer: No Payment, Other | Admitting: Psychiatry

## 2022-04-02 VITALS — BP 144/76 | HR 80

## 2022-04-02 DIAGNOSIS — F3340 Major depressive disorder, recurrent, in remission, unspecified: Secondary | ICD-10-CM

## 2022-04-02 DIAGNOSIS — F17210 Nicotine dependence, cigarettes, uncomplicated: Secondary | ICD-10-CM

## 2022-04-02 MED ORDER — HYDROXYZINE HCL 25 MG PO TABS: 12.5000 mg | ORAL_TABLET | Freq: Three times a day (TID) | ORAL | 2 refills | Status: DC

## 2022-04-02 MED ORDER — CITALOPRAM HYDROBROMIDE 20 MG PO TABS: 20.0000 mg | ORAL_TABLET | Freq: Every day | ORAL | 2 refills | Status: DC

## 2022-04-02 NOTE — Progress Notes (Cosign Needed Addendum)
BH MD/PA/NP OP Progress Note  04/02/2022 1:35 PM Katie Montgomery  MRN:  [id number]  Chief Complaint:  Medication refills HPI:   Katie Montgomery is a 62 year old female with a past psychiatric history significant for major depressive disorder with anxious distress who presents to [facility] for follow-up and medication management.    Patient reports that she has been doing really well.  She reports that her mood has been stable and denies any severe panic episodes. Currently, she denies SI, HI, AVH.  She reports that she had good Christmas and she spent time with her children and grandchildren.  She reports that her friend is expecting a baby in next 2 weeks and she is excited for her.  She usually sleeps well but did not sleep well last night as she was anxious about today's appointment.  She takes half tablet of hydroxyzine to help with anxiety and sleep.  She reports stable appetite.  She denies any changes in her stressors. She lives by herself. She denies use of alcohol or drugs.  She does smokes 1 pack/day cigarettes and has been trying to cut down.  She reports that she was not able to pick up nicotine gum as it was not available at the pharmacy.   Patient denies any need for change in medication and medication dosages and wants to continue same meds.  Discussed getting blood work done at clinic including CBC, CMP, HbA1c, TSH and lipid panel.  Patient agreed initially but later changed her mind that she will do it next time as she is anxious today. Patient is alert and oriented x 4, anxious, cooperative, and fully engaged in conversation during the encounter.  Her thought process is linear with coherent speech . She does not appear to be responding to internal/external stimuli .   Visit Diagnosis:    ICD-10-CM   1. Cigarette nicotine dependence without complication  W09.811     2. 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  Past Medical History:  Past Medical History:  Diagnosis Date   Allergy    Anxiety    Depression    Hypertension     Past Surgical History:  Procedure Laterality Date   APPENDECTOMY     cyst on ovary      Family Psychiatric History:  Denied  Family History:  Family History  Problem Relation Age of Onset   Arthritis Mother    Diabetes Father    Diabetes Maternal Grandmother     Social History:  Social History   Socioeconomic History   Marital status: Single    Spouse name: Not on file   Number of children: Not on file   Years of education: Not on file   Highest education level: Not on file  Occupational History   Not on file  Tobacco Use   Smoking status: Every Day    Packs/day: 0.50    Types: Cigarettes   Smokeless tobacco: Never  Substance and Sexual Activity   Alcohol use: No   Drug use: No   Sexual activity: Not Currently  Birth control/protection: Condom, Spermicide  Other Topics Concern   Not on file  Social History Narrative   Not on file   Social Determinants of Health   Financial Resource Strain: Not on file  Food Insecurity: Not on file  Transportation Needs: Not on file  Physical Activity: Not on file  Stress: Not on file  Social Connections: Not on file    Allergies:  Allergies  Allergen Reactions   Erythromycin    Penicillins     Metabolic Disorder Labs: No results found for: "HGBA1C", "MPG" No results found for: "PROLACTIN" Lab Results  Component Value Date   CHOL 191 10/27/2009   TRIG 124 10/27/2009   HDL 40 10/27/2009   CHOLHDL 4.8 Ratio 10/27/2009   VLDL 25 10/27/2009   LDLCALC 126 (H) 10/27/2009   LDLCALC 118 (H) 05/17/2008    Lab Results  Component Value Date   TSH 0.885 10/27/2009   TSH 1.180 05/17/2008    Therapeutic Level Labs: No results found for: "LITHIUM" No results found for: "VALPROATE" No results found for: "CBMZ"  Current Medications: Current Outpatient Medications  Medication Sig Dispense Refill   calcium carbonate (TUMS - DOSED IN MG ELEMENTAL CALCIUM) 500 MG chewable tablet Chew 1 tablet by mouth daily.     citalopram (CELEXA) 20 MG tablet Take 1 tablet (20 mg total) by mouth daily. 30 tablet 2   diphenhydrAMINE (SOMINEX) 25 MG tablet Take 25 mg by mouth as needed.     hydrOXYzine (ATARAX) 25 MG tablet Take 0.5 tablets (12.5 mg total) by mouth 3 (three) times daily. 45 tablet 2   ibuprofen (ADVIL,MOTRIN) 200 MG tablet Take 200 mg by mouth 3 (three) times daily.     lisinopril-hydrochlorothiazide (PRINZIDE,ZESTORETIC) 20-25 MG per tablet Take 1 tablet by mouth daily.     nicotine polacrilex (NICORETTE) 2 MG gum Take 1 each (2 mg total) by mouth as needed for smoking cessation. 100 tablet 2   omeprazole (PRILOSEC) 20 MG capsule Take 20 mg by mouth daily.     No current facility-administered medications for this visit.     Musculoskeletal: Strength & Muscle Tone: within normal limits Gait & Station: normal Patient leans: N/A  Psychiatric Specialty Exam: Review of Systems  Psychiatric/Behavioral:  Negative for decreased concentration, dysphoric mood, hallucinations, self-injury, sleep disturbance and suicidal ideas. The patient is not nervous/anxious and is not hyperactive.     Blood pressure (!) 144/76, pulse 80, SpO2 99 %.There is no height or weight on file to calculate BMI.  General Appearance: Well Groomed  Eye Contact:  Good  Speech:  Clear and Coherent and Normal Rate  Volume:  Normal  Mood:  Euthymic  Affect:  Appropriate and Full Range  Thought Process:  Coherent and Descriptions of Associations: Tangential  Orientation:  Full (Time, Place, and Person)  Thought Content:  WDL   Suicidal Thoughts:  No  Homicidal Thoughts:  No  Memory:  Immediate;   Good Recent;   Good Remote;   Good  Judgement:  Good  Insight:  Good  Psychomotor Activity:  Normal  Concentration:  Concentration: Good and Attention Span: Good  Recall:  Good  Fund of Knowledge: Good  Language: Good  Akathisia:  No  Handed:  Right  AIMS (if indicated): not done  Assets:  Communication Skills Desire for Improvement Financial Resources/Insurance Housing Leisure Time Physical Health Social Support  ADL's:  Intact  Cognition: WNL  Sleep:  Fair   Screenings: GAD-7    Flowsheet Row Clinical Support  from 09/26/2021 in [facility] Office Visit from 06/27/2021 in [place] from 03/06/2021 in [facility] Clinical Support from 11/29/2020 in [facility] Office Visit from 05/30/2020 in [facility]  Total GAD-7 Score '3 2 1 '$ 0 5      PHQ2-9    Flowsheet Row Clinical Support from 09/26/2021 in [facility] Office Visit from 06/27/2021 in [place] from 03/06/2021 in [place] from 11/29/2020 in [facility] Office Visit from 05/30/2020 in [facility]  PHQ-2 Total Score 0 0 0 0 0  PHQ-9 Total Score -- -- '1 1 2      '$ Flowsheet Row Office Visit from 06/27/2021 in [facility] Office Visit from 05/30/2020 in [place] No Risk        Assessment and Plan:   Brenetta Penny. [place] is a 62 year old female with a past psychiatric history significant for major depressive disorder with anxious distress who presents to [facility] for follow-up and  medication management.    She reports that her medications are helpful in managing her depression and anxiety. Her depression and anxiety has been stable with current medications.  Patient's medications to be e-prescribed to pharmacy of choice.  Collaboration of Care: Collaboration of Care: none  Patient/Guardian was advised Release of Information must be obtained prior to any record release in order to collaborate their care with an outside provider. Patient/Guardian was advised if they have not already done so to contact the registration department to sign all necessary forms in order for [place] to release information regarding their care.   Consent: Patient/Guardian gives verbal consent for treatment and assignment of benefits for services provided during this visit. Patient/Guardian expressed understanding and agreed to proceed.   1. Major depressive disorder, in remission (HCC)  - hydrOXYzine (ATARAX) 25 MG tablet; Take 1/2 tablet (12.5 mg total) by mouth 3 (three) times daily.  Dispense: 45 tablets; Refill: 2 - citalopram (CELEXA) 20 MG tablet; Take 1 tablet (20 mg total) by mouth daily.  Dispense: 30 tablet; Refill: 2 -Ordered CBC, CMP, Hb A1c, lipid panel, TSH-patient wants to get lab work done at next visit.  2.  Cigarette nicotine dependence -nicotine gum 2 mg as needed.   Patient to follow up in 2 months.  Provider spent a total of 20 minutes with the patient/reviewing patient's chart.   Katie Reichert, MD PGY 3 04/02/2022, 1:35 PM

## 2022-06-01 ENCOUNTER — Ambulatory Visit (INDEPENDENT_AMBULATORY_CARE_PROVIDER_SITE_OTHER): Payer: No Payment, Other | Admitting: Psychiatry

## 2022-06-01 DIAGNOSIS — F3340 Major depressive disorder, recurrent, in remission, unspecified: Secondary | ICD-10-CM | POA: Diagnosis not present

## 2022-06-01 MED ORDER — CITALOPRAM HYDROBROMIDE 20 MG PO TABS: 20.0000 mg | ORAL_TABLET | Freq: Every day | ORAL | 2 refills | Status: DC

## 2022-06-01 MED ORDER — HYDROXYZINE HCL 25 MG PO TABS: 12.5000 mg | ORAL_TABLET | Freq: Three times a day (TID) | ORAL | 2 refills | Status: DC

## 2022-06-01 NOTE — Progress Notes (Addendum)
BH MD/PA/NP OP Progress Note  06/01/2022 1:38 PM [name]  MRN:  [id number]  Chief Complaint:  Medication refills HPI:   Katie Montgomery. Katie Montgomery is a 62 year old female with a past psychiatric history significant for major depressive disorder with anxious distress who presents to [facility] for follow-up and medication management.    Patient reports that she has been doing really well.  She reports that her mood has been stable and denies any depression and severe panic episodes. Currently, she denies SI, HI, AVH.  Denies any changes in her stressors.  She reports that her friend had a baby 1 month ago and she is happy for her.  She sleeps well at night.  She takes half tablet of hydroxyzine to help with anxiety and sleep.  She reports stable appetite.  She lives by herself. She does not drink alcohol or use illicit drugs.  She does smokes 1 pack/day cigarettes and has been trying to cut down.  She tried nicotine patch in the past which did not work.  She has not tried nicotine gum yet but will get it soon.  Discussed if she wants to quit, she can also talk to her PCP for Chantix.  Patient denies any need for change in medication and medication dosages and wants to continue same meds.  Blood work done was ordered at last visit  including CBC, CMP, HbA1c, TSH and lipid panel but patient has not done yet.  Patient will have to make an appointment for blood work at the clinic.  Recommend getting an EKG at PCP office as patient is on Celexa.   Patient is alert and oriented x 4, anxious, cooperative, and fully engaged in conversation during the encounter.  Her thought process is linear with coherent speech . She does not appear to be responding to internal/external stimuli .   Visit Diagnosis:    ICD-10-CM   1. 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  Past Medical History:  Past Medical History:  Diagnosis Date   Allergy    Anxiety    Depression    Hypertension     Past Surgical History:  Procedure Laterality Date   APPENDECTOMY     cyst on ovary      Family Psychiatric History:  Denied  Family History:  Family History  Problem Relation Age of Onset   Arthritis Mother    Diabetes Father    Diabetes Maternal Grandmother     Social History:  Social History   Socioeconomic History   Marital status: Single    Spouse name: Not on file   Number of children: Not on file   Years of education: Not on file   Highest education level: Not on file  Occupational History   Not on file  Tobacco Use   Smoking status: Every Day    Packs/day: .5    Types: Cigarettes   Smokeless tobacco: Never  Substance and Sexual Activity   Alcohol use: No   Drug use: No   Sexual activity: Not Currently    Birth control/protection: Condom, Spermicide  Other Topics Concern   Not on file  Social History Narrative   Not on file   Social Determinants of Health  Financial Resource Strain: Not on file  Food Insecurity: Not on file  Transportation Needs: Not on file  Physical Activity: Not on file  Stress: Not on file  Social Connections: Not on file    Allergies:  Allergies  Allergen Reactions   Erythromycin    Penicillins     Metabolic Disorder Labs: No results found for: "HGBA1C", "MPG" No results found for: "PROLACTIN" Lab Results  Component Value Date   CHOL 191 10/27/2009   TRIG 124 10/27/2009   HDL 40 10/27/2009   CHOLHDL 4.8 Ratio 10/27/2009   VLDL 25 10/27/2009   LDLCALC 126 (H) 10/27/2009   LDLCALC 118 (H) 05/17/2008   Lab Results  Component Value Date   TSH 0.885 10/27/2009   TSH 1.180 05/17/2008    Therapeutic Level Labs: No results found for: "LITHIUM" No results found for:  "VALPROATE" No results found for: "CBMZ"  Current Medications: Current Outpatient Medications  Medication Sig Dispense Refill   calcium carbonate (TUMS - DOSED IN MG ELEMENTAL CALCIUM) 500 MG chewable tablet Chew 1 tablet by mouth daily.     citalopram (CELEXA) 20 MG tablet Take 1 tablet (20 mg total) by mouth daily. 30 tablet 2   diphenhydrAMINE (SOMINEX) 25 MG tablet Take 25 mg by mouth as needed.     hydrOXYzine (ATARAX) 25 MG tablet Take 0.5 tablets (12.5 mg total) by mouth 3 (three) times daily. 45 tablet 2   ibuprofen (ADVIL,MOTRIN) 200 MG tablet Take 200 mg by mouth 3 (three) times daily.     lisinopril-hydrochlorothiazide (PRINZIDE,ZESTORETIC) 20-25 MG per tablet Take 1 tablet by mouth daily.     nicotine polacrilex (NICORETTE) 2 MG gum Take 1 each (2 mg total) by mouth as needed for smoking cessation. 100 tablet 2   omeprazole (PRILOSEC) 20 MG capsule Take 20 mg by mouth daily.     No current facility-administered medications for this visit.     Musculoskeletal: Strength & Muscle Tone: within normal limits Gait & Station: normal Patient leans: N/A  Psychiatric Specialty Exam: Review of Systems  Psychiatric/Behavioral:  Negative for decreased concentration, dysphoric mood, hallucinations, self-injury, sleep disturbance and suicidal ideas. The patient is not nervous/anxious and is not hyperactive.     Blood pressure 115/83, pulse 82, SpO2 100 %.There is no height or weight on file to calculate BMI.  General Appearance: Well Groomed  Eye Contact:  Good  Speech:  Clear and Coherent and Normal Rate  Volume:  Normal  Mood:  Euthymic  Affect:  Appropriate and Full Range  Thought Process:  Coherent and Descriptions of Associations: Tangential  Orientation:  Full (Time, Place, and Person)  Thought Content: WDL   Suicidal Thoughts:  No  Homicidal Thoughts:  No  Memory:  Immediate;   Good Recent;   Good Remote;   Good  Judgement:  Good  Insight:  Good  Psychomotor Activity:   Normal  Concentration:  Concentration: Good and Attention Span: Good  Recall:  Good  Fund of Knowledge: Good  Language: Good  Akathisia:  No  Handed:  Right  AIMS (if indicated): not done  Assets:  Communication Skills Desire for Improvement Financial Resources/Insurance Housing Leisure Time Physical Health Social Support  ADL's:  Intact  Cognition: WNL  Sleep:  Good   Screenings: GAD-7    Flowsheet Row Clinical Support from 09/26/2021 in [facility] Office Visit from 06/27/2021 in [place] from 03/06/2021 in [place] from  11/29/2020 in [facility] Office Visit from 05/30/2020 in [facility]  Total GAD-7 Score 3 2 1  0 5      PHQ2-9    Flowsheet Row Clinical Support from 09/26/2021 in [facility] Office Visit from 06/27/2021 in [place] from 03/06/2021 in [place] from 11/29/2020 in [facility] Office Visit from 05/30/2020 in [facility]  PHQ-2 Total Score 0 0 0 0 0  PHQ-9 Total Score -- -- 1 1 2       Flowsheet Row Office Visit from 06/27/2021 in [facility] Office Visit from 05/30/2020 in [place] No Risk        Assessment and Plan:   Demelza Cockrill. Hinchcliffe is a 62 year old female with a past psychiatric history significant for major depressive disorder with anxious distress who presents to [facility] for follow-up and medication management.    She reports that her medications are helpful in managing her depression and anxiety. Her depression and anxiety has been stable with current  medications.  She denies any need for changing medication or medication dosages.  Patient's medications to be e-prescribed to pharmacy of choice.  Collaboration of Care: Collaboration of Care: none  Patient/Guardian was advised Release of Information must be obtained prior to any record release in order to collaborate their care with an outside provider. Patient/Guardian was advised if they have not already done so to contact the registration department to sign all necessary forms in order for [place] to release information regarding their care.   Consent: Patient/Guardian gives verbal consent for treatment and assignment of benefits for services provided during this visit. Patient/Guardian expressed understanding and agreed to proceed.   1. Major depressive disorder, in remission (HCC)  - hydrOXYzine (ATARAX) 25 MG tablet; Take 1/2 tablet (12.5 mg total) by mouth 3 (three) times daily.  Dispense: 45 tablets; Refill: 2 - citalopram (CELEXA) 20 MG tablet; Take 1 tablet (20 mg total) by mouth daily.  Dispense: 30 tablet; Refill: 2 -Ordered CBC, CMP, Hb A1c, lipid panel, TSH-patient will have to make an appointment for lab work -Recommend getting baseline EKG at PCP office.  2.  Cigarette nicotine dependence -nicotine gum 2 mg as needed.  -Can talk to PCP about Chantix.  Patient to follow up in 3 months.  Provider spent a total of 20 minutes with the patient/reviewing patient's chart.   Armando Reichert, MD PGY 3 06/01/2022, 1:38 PM

## 2022-08-31 ENCOUNTER — Ambulatory Visit (INDEPENDENT_AMBULATORY_CARE_PROVIDER_SITE_OTHER): Payer: No Payment, Other | Admitting: Psychiatry

## 2022-08-31 VITALS — BP 122/83 | HR 95 | Wt 108.0 lb

## 2022-08-31 DIAGNOSIS — F3340 Major depressive disorder, recurrent, in remission, unspecified: Secondary | ICD-10-CM

## 2022-08-31 MED ORDER — HYDROXYZINE HCL 25 MG PO TABS: 12.5000 mg | ORAL_TABLET | Freq: Three times a day (TID) | ORAL | 1 refills | Status: DC

## 2022-08-31 MED ORDER — CITALOPRAM HYDROBROMIDE 20 MG PO TABS
20.0000 mg | ORAL_TABLET | Freq: Every day | ORAL | 1 refills | Status: DC
Start: 2022-08-31 — End: 2022-11-13
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
## 2022-11-13 ENCOUNTER — Encounter (HOSPITAL_COMMUNITY): Payer: Self-pay | Admitting: Psychiatry

## 2022-11-13 ENCOUNTER — Ambulatory Visit (INDEPENDENT_AMBULATORY_CARE_PROVIDER_SITE_OTHER): Payer: No Payment, Other | Admitting: Psychiatry

## 2022-11-13 ENCOUNTER — Telehealth (HOSPITAL_COMMUNITY): Payer: Self-pay | Admitting: *Deleted

## 2022-11-13 DIAGNOSIS — F3340 Major depressive disorder, recurrent, in remission, unspecified: Secondary | ICD-10-CM | POA: Diagnosis not present

## 2022-11-13 DIAGNOSIS — F17210 Nicotine dependence, cigarettes, uncomplicated: Secondary | ICD-10-CM | POA: Diagnosis not present

## 2022-11-13 DIAGNOSIS — F1021 Alcohol dependence, in remission: Secondary | ICD-10-CM | POA: Insufficient documentation

## 2022-11-13 MED ORDER — CITALOPRAM HYDROBROMIDE 20 MG PO TABS
20.0000 mg | ORAL_TABLET | Freq: Every day | ORAL | 3 refills | Status: DC
Start: 2022-11-13 — End: 2023-02-05

## 2022-11-13 MED ORDER — HYDROXYZINE HCL 25 MG PO TABS
12.5000 mg | ORAL_TABLET | Freq: Three times a day (TID) | ORAL | 3 refills | Status: DC
Start: 2022-11-13 — End: 2023-02-05

## 2022-11-13 MED ORDER — NICOTINE POLACRILEX 2 MG MT GUM
2.0000 mg | CHEWING_GUM | OROMUCOSAL | 5 refills | Status: DC | PRN
Start: 2022-11-13 — End: 2023-05-21

## 2022-11-13 NOTE — Patient Instructions (Signed)

## 2022-11-13 NOTE — Telephone Encounter (Signed)
Seen by Dr Josephina Shih today and writer did vitals. Weight 110 #, 5 feet 2 in, pulse=76, BP =147/79 and O2=98. She is taking BP meds and states she takes them routinely.
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
## 2023-02-05 ENCOUNTER — Ambulatory Visit (INDEPENDENT_AMBULATORY_CARE_PROVIDER_SITE_OTHER): Payer: No Payment, Other | Admitting: Psychiatry

## 2023-02-05 ENCOUNTER — Encounter (HOSPITAL_COMMUNITY): Payer: Self-pay | Admitting: Psychiatry

## 2023-02-05 VITALS — BP 115/67 | HR 81 | Wt 110.6 lb

## 2023-02-05 DIAGNOSIS — F1021 Alcohol dependence, in remission: Secondary | ICD-10-CM | POA: Diagnosis not present

## 2023-02-05 DIAGNOSIS — F3340 Major depressive disorder, recurrent, in remission, unspecified: Secondary | ICD-10-CM | POA: Diagnosis not present

## 2023-02-05 DIAGNOSIS — F17200 Nicotine dependence, unspecified, uncomplicated: Secondary | ICD-10-CM | POA: Diagnosis not present

## 2023-02-05 MED ORDER — CITALOPRAM HYDROBROMIDE 20 MG PO TABS
20.0000 mg | ORAL_TABLET | Freq: Every day | ORAL | 3 refills | Status: DC
Start: 2023-02-05 — End: 2023-05-21

## 2023-02-05 MED ORDER — HYDROXYZINE HCL 25 MG PO TABS
12.5000 mg | ORAL_TABLET | Freq: Three times a day (TID) | ORAL | 3 refills | Status: DC | PRN
Start: 2023-02-05 — End: 2023-05-21

## 2023-02-05 MED ORDER — VARENICLINE TARTRATE 0.5 MG PO TABS: ORAL_TABLET | ORAL | 3 refills | Status: DC

## 2023-02-05 NOTE — Patient Instructions (Addendum)
Thank you for attending your appointment today.  -- START Chantix 0.5 mg daily; after 1 week increase to 1 mg daily -- Continue other medications as prescribed.  Please do not make any changes to medications without first discussing with your provider. If you are experiencing a psychiatric emergency, please call 911 or present to your nearest emergency department. Additional crisis, medication management, and therapy resources are included below.  [facility]  [address] [place], [place] [address] [phone] WALK-IN URGENT CARE 24/7 FOR ANYONE [address], [place], [place]  [phone] Fax: [phone] [email or web address] *Interpreters available *Accepts all insurance and uninsured for Urgent Care needs *Accepts Medicaid and uninsured for outpatient treatment (below)      ONLY FOR [facility]  Below:    Outpatient New Patient Assessment/Therapy Walk-ins:        Monday, Wednesday, and Thursday 8am until slots are full (first come, first served)                   New Patient Psychiatry/Medication Management        Monday-Friday 8am-11am (first come, first served)               For all walk-ins we ask that you arrive by 7:15am, because patients will be seen in the order of arrival.

## 2023-03-26 ENCOUNTER — Telehealth (HOSPITAL_COMMUNITY): Payer: Self-pay | Admitting: Psychiatry

## 2023-05-07 ENCOUNTER — Encounter (HOSPITAL_COMMUNITY): Payer: No Payment, Other | Admitting: Psychiatry
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
## 2023-05-21 ENCOUNTER — Encounter (HOSPITAL_COMMUNITY): Payer: Self-pay | Admitting: Psychiatry

## 2023-05-21 ENCOUNTER — Ambulatory Visit (HOSPITAL_COMMUNITY): Payer: No Payment, Other | Admitting: Psychiatry

## 2023-05-21 VITALS — BP 133/65 | HR 88 | Ht 62.5 in | Wt 111.0 lb

## 2023-05-21 DIAGNOSIS — F3340 Major depressive disorder, recurrent, in remission, unspecified: Secondary | ICD-10-CM | POA: Diagnosis not present

## 2023-05-21 DIAGNOSIS — F17200 Nicotine dependence, unspecified, uncomplicated: Secondary | ICD-10-CM

## 2023-05-21 DIAGNOSIS — F17210 Nicotine dependence, cigarettes, uncomplicated: Secondary | ICD-10-CM

## 2023-05-21 DIAGNOSIS — F1021 Alcohol dependence, in remission: Secondary | ICD-10-CM | POA: Diagnosis not present

## 2023-05-21 MED ORDER — CITALOPRAM HYDROBROMIDE 20 MG PO TABS
20.0000 mg | ORAL_TABLET | Freq: Every day | ORAL | 3 refills | Status: DC
Start: 2023-05-21 — End: 2023-08-20

## 2023-05-21 MED ORDER — NICOTINE POLACRILEX 2 MG MT LOZG: 2.0000 mg | LOZENGE | OROMUCOSAL | 3 refills | Status: DC | PRN

## 2023-05-21 MED ORDER — HYDROXYZINE HCL 25 MG PO TABS
12.5000 mg | ORAL_TABLET | Freq: Three times a day (TID) | ORAL | 3 refills | Status: DC | PRN
Start: 2023-05-21 — End: 2023-08-20

## 2023-05-21 NOTE — Patient Instructions (Addendum)
 Thank you for attending your appointment today.  -- START nicotine lozenges as needed for tobacco cessation -- Continue other medications as prescribed.  Please do not make any changes to medications without first discussing with your provider. If you are experiencing a psychiatric emergency, please call 911 or present to your nearest emergency department. Additional crisis, medication management, and therapy resources are included below.  [facility]  [address] [place], [place] [address] [phone] WALK-IN URGENT CARE 24/7 FOR ANYONE [address] [place], [place]  [phone] Fax: [phone] [email or web address] *Interpreters available *Accepts all insurance and uninsured for Urgent Care needs *Accepts Medicaid and uninsured for outpatient treatment (below)      ONLY FOR [facility]  Below:    Outpatient New Patient Assessment/Therapy Walk-ins:        Monday, Wednesday, and Thursday 8am until slots are full (first come, first served)                   New Patient Psychiatry/Medication Management        Monday-Friday 8am-11am (first come, first served)               For all walk-ins we ask that you arrive by 7:15am, because patients will be seen in the order of arrival.
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
## 2023-08-20 ENCOUNTER — Ambulatory Visit (INDEPENDENT_AMBULATORY_CARE_PROVIDER_SITE_OTHER): Admitting: Psychiatry

## 2023-08-20 ENCOUNTER — Encounter (HOSPITAL_COMMUNITY): Payer: Self-pay | Admitting: Psychiatry

## 2023-08-20 VITALS — BP 146/75 | HR 75 | Ht 62.5 in | Wt 106.0 lb

## 2023-08-20 DIAGNOSIS — F17210 Nicotine dependence, cigarettes, uncomplicated: Secondary | ICD-10-CM

## 2023-08-20 DIAGNOSIS — F3340 Major depressive disorder, recurrent, in remission, unspecified: Secondary | ICD-10-CM | POA: Diagnosis not present

## 2023-08-20 DIAGNOSIS — F17200 Nicotine dependence, unspecified, uncomplicated: Secondary | ICD-10-CM

## 2023-08-20 DIAGNOSIS — F1021 Alcohol dependence, in remission: Secondary | ICD-10-CM

## 2023-08-20 MED ORDER — HYDROXYZINE HCL 25 MG PO TABS: 12.5000 mg | ORAL_TABLET | Freq: Three times a day (TID) | ORAL | 3 refills | Status: DC | PRN

## 2023-08-20 MED ORDER — CITALOPRAM HYDROBROMIDE 20 MG PO TABS: 20.0000 mg | ORAL_TABLET | Freq: Every day | ORAL | 3 refills | Status: DC

## 2023-08-20 NOTE — Patient Instructions (Signed)
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
## 2023-11-26 ENCOUNTER — Ambulatory Visit (HOSPITAL_COMMUNITY): Admitting: Psychiatry

## 2023-11-26 ENCOUNTER — Encounter (HOSPITAL_COMMUNITY): Payer: Self-pay | Admitting: Psychiatry

## 2023-11-26 VITALS — BP 132/76 | HR 74 | Resp 16 | Ht 62.0 in | Wt 102.6 lb

## 2023-11-26 DIAGNOSIS — F3340 Major depressive disorder, recurrent, in remission, unspecified: Secondary | ICD-10-CM

## 2023-11-26 DIAGNOSIS — F411 Generalized anxiety disorder: Secondary | ICD-10-CM | POA: Diagnosis not present

## 2023-11-26 DIAGNOSIS — F1021 Alcohol dependence, in remission: Secondary | ICD-10-CM

## 2023-11-26 MED ORDER — HYDROXYZINE HCL 25 MG PO TABS: 12.5000 mg | ORAL_TABLET | Freq: Three times a day (TID) | ORAL | 3 refills | Status: DC | PRN

## 2023-11-26 MED ORDER — CITALOPRAM HYDROBROMIDE 20 MG PO TABS: 20.0000 mg | ORAL_TABLET | Freq: Every day | ORAL | 3 refills | Status: DC

## 2023-11-26 NOTE — Patient Instructions (Signed)

## 2024-02-25 ENCOUNTER — Ambulatory Visit (INDEPENDENT_AMBULATORY_CARE_PROVIDER_SITE_OTHER): Admitting: Psychiatry

## 2024-02-25 DIAGNOSIS — F3340 Major depressive disorder, recurrent, in remission, unspecified: Secondary | ICD-10-CM

## 2024-02-25 MED ORDER — CITALOPRAM HYDROBROMIDE 20 MG PO TABS: 20.0000 mg | ORAL_TABLET | Freq: Every day | ORAL | 3 refills | Status: AC

## 2024-02-25 MED ORDER — HYDROXYZINE HCL 25 MG PO TABS: 12.5000 mg | ORAL_TABLET | Freq: Three times a day (TID) | ORAL | 3 refills | Status: AC | PRN

## 2024-02-25 MED ORDER — NICOTINE POLACRILEX 2 MG MT LOZG: 2.0000 mg | LOZENGE | OROMUCOSAL | 3 refills | Status: AC | PRN

## 2024-02-25 NOTE — Progress Notes (Signed)
 BH MD/PA/NP OP Progress Note  02/25/2024 3:57 PM Katie Montgomery  MRN:  [id number]  Chief Complaint: Everything is fine    HPI: 63 year old female seen today for follow-up psychiatric evaluation.  She has a psychiatric history of depression, anxiety, alcohol use (in remission 20 years).  She is currently managed on Celexa  20 mg daily and hydroxyzine  12.5-25 mg three times daily as needed.  She reports her medications are effective in managing her psychiatric conditions.  Today she is pleasant, cooperative, engaged in conversation, maintained eye contact.  She informed clinical research associate that everything is fine. She notes that when she is without her medications she feels on edge but reports that with her medications she feels stable. Patient notes that her daughter recently moved to a new home and started a new job. She does archivist that she is concerned for her grandson and his adjustment with the move.  Patient also reports that her son is doing well.  Patient notes that on Thursday she helped care for her brother by cleaning her home.  She also notes that she has been spending time with her friend Brittany and enjoying her young children.  Overall she feels her anxiety and depression are well-managed.  Today provider conducted GAD-7 and he scored 5.  Provider also conducted PHQ-9 and patient scored a 2.  She endorsed adequate sleep and appetite.  Today she denies SI/HI/VAH, mania, or paranoia.    Patient informed clinical research associate that she continues to maintain her sobriety from alcohol.  She notes that its been 20 years.   No medication changes made today.  Patient agreeable to continue medication as prescribed.  No other concerns at this time.     Visit Diagnosis:    ICD-10-CM   1. MDD (recurrent major depressive disorder) in remission  F33.40 citalopram  (CELEXA ) 20 MG tablet    hydrOXYzine  (ATARAX ) 25 MG tablet       Past Psychiatric History: Depression, anxiety, alcohol use disorder now in  remission.  Has been sober for 20 years.  Past Medical History:  Past Medical History:  Diagnosis Date   Allergy    Anxiety    Depression    Hypertension     Past Surgical History:  Procedure Laterality Date   APPENDECTOMY     cyst on ovary      Family Psychiatric History: denied  Family History:  Family History  Problem Relation Age of Onset   Arthritis Mother    Diabetes Father    ALS Father    Diabetes Maternal Grandmother     Social History:  Social History   Socioeconomic History   Marital status: Single    Spouse name: Not on file   Number of children: Not on file   Years of education: Not on file   Highest education level: Not on file  Occupational History   Not on file  Tobacco Use   Smoking status: Every Day    Current packs/day: 1.00    Types: Cigarettes   Smokeless tobacco: Never  Substance and Sexual Activity   Alcohol use: Not Currently   Drug use: No   Sexual activity: Not Currently    Birth control/protection: Condom, Spermicide  Other Topics Concern   Not on file  Social History Narrative   Not on file   Social Drivers of Health   Financial Resource Strain: Not on file  Food Insecurity: Not on file  Transportation Needs: Not on file  Physical Activity: Not on file  Stress: Not on file  Social Connections: Not on file    Allergies:  Allergies  Allergen Reactions   Erythromycin    Penicillins     Metabolic Disorder Labs: No results found for: HGBA1C, MPG No results found for: PROLACTIN Lab Results  Component Value Date   CHOL 191 10/27/2009   TRIG 124 10/27/2009   HDL 40 10/27/2009   CHOLHDL 4.8 Ratio 10/27/2009   VLDL 25 10/27/2009   LDLCALC 126 (H) 10/27/2009   LDLCALC 118 (H) 05/17/2008   Lab Results  Component Value Date   TSH 0.885 10/27/2009   TSH 1.180 05/17/2008    Therapeutic Level Labs: No results found for: LITHIUM No results found for: VALPROATE No results found for: CBMZ  Current  Medications: Current Outpatient Medications  Medication Sig Dispense Refill   calcium carbonate (TUMS - DOSED IN MG ELEMENTAL CALCIUM) 500 MG chewable tablet Chew 1 tablet by mouth daily. (Patient not taking: Reported on 08/20/2023)     citalopram  (CELEXA ) 20 MG tablet Take 1 tablet (20 mg total) by mouth daily. 30 tablet 3   diphenhydrAMINE (SOMINEX) 25 MG tablet Take 25 mg by mouth as needed. (Patient not taking: Reported on 08/20/2023)     hydrochlorothiazide (HYDRODIURIL) 25 MG tablet Take 25 mg by mouth every morning.     hydrOXYzine  (ATARAX ) 25 MG tablet Take 0.5 tablets (12.5 mg total) by mouth 3 (three) times daily as needed for anxiety (or sleep). 45 tablet 3   ibuprofen (ADVIL,MOTRIN) 200 MG tablet Take 200 mg by mouth daily in the afternoon.     lisinopril (ZESTRIL) 10 MG tablet Take 10 mg by mouth daily.     nicotine  polacrilex (COMMIT) 2 MG lozenge Take 1 lozenge (2 mg total) by mouth as needed for smoking cessation. 108 tablet 3   omeprazole (PRILOSEC) 20 MG capsule Take 20 mg by mouth daily.     No current facility-administered medications for this visit.     Musculoskeletal: Strength & Muscle Tone: within normal limits Gait & Station: normal Patient leans: N/A  Psychiatric Specialty Exam: Review of Systems  Blood pressure 135/60, pulse 89, temperature 98.1 F (36.7 C), weight 108 lb 3.2 oz (49.1 kg), SpO2 98%.Body mass index is 19.79 kg/m.  General Appearance: Well Groomed  Eye Contact:  Good  Speech:  Clear and Coherent and Normal Rate  Volume:  Normal  Mood:  Euthymic  Affect:  Appropriate and Congruent  Thought Process:  Coherent, Goal Directed, and Linear  Orientation:  Full (Time, Place, and Person)  Thought Content: WDL and Logical   Suicidal Thoughts:  No  Homicidal Thoughts:  No  Memory:  Immediate;   Good Recent;   Good Remote;   Good  Judgement:  Good  Insight:  Good  Psychomotor Activity:  Normal  Concentration:  Concentration: Good and Attention  Span: Good  Recall:  Good  Fund of Knowledge: Good  Language: Good  Akathisia:  No  Handed:  Right  AIMS (if indicated): not done  Assets:  Communication Skills Desire for Improvement Financial Resources/Insurance Housing Leisure Time Physical Health Social Support  ADL's:  Intact  Cognition: WNL  Sleep:  Good   Screenings: GAD-7    Flowsheet Row Clinical Support from 02/25/2024 in [facility] Clinical Support from 09/26/2021 in [facility] Office Visit from 06/27/2021 in [facility] Clinical Support from 03/06/2021 in [facility] Clinical Support from 11/29/2020 in [facility]  Total GAD-7 Score 5 3 2 1  0   PHQ2-9    Flowsheet Row Clinical Support from 02/25/2024 in [facility] Clinical Support from 09/26/2021 in [facility] Office Visit from 06/27/2021 in [facility] Clinical Support from 03/06/2021 in [facility] Clinical Support from 11/29/2020 in [facility]  PHQ-2 Total Score 0 0 0 0 0  PHQ-9 Total Score 2 -- -- 1 1   Flowsheet Row Office Visit from 06/27/2021 in [facility] Office Visit from 05/30/2020 in [facility]  C-SSRS RISK CATEGORY Low Risk No Risk     Assessment and Plan: Patient notes that she is doing well on her current medication regimen.  No medication changes made today.  Patient agreeable to continue medications as prescribed.    1. MDD (recurrent major depressive disorder) in remission  Continue- citalopram  (CELEXA ) 20 MG tablet; Take 1 tablet (20 mg total) by mouth daily.  Dispense: 30 tablet; Refill: 3 Continue- hydrOXYzine  (ATARAX ) 25 MG tablet; Take 0.5 tablets (12.5 mg total) by mouth 3 (three) times daily as needed  for anxiety (or sleep).  Dispense: 45 tablet; Refill: 3  Follow-up in 66-month Zane FORBES Bach, NP 02/25/2024, 3:57 PM

## 2024-05-07 ENCOUNTER — Encounter (HOSPITAL_COMMUNITY): Admitting: Psychiatry

## 2024-05-12 ENCOUNTER — Encounter (HOSPITAL_COMMUNITY): Admitting: Psychiatry
